# Patient Record
Sex: Female | Born: 1965 | Race: White | Hispanic: No | Marital: Married | Smoking: Never smoker
Health system: Southern US, Community
[De-identification: ages and names within clinical notes are randomized; demographics above are authoritative.]

## PROBLEM LIST (undated history)

## (undated) DIAGNOSIS — Z8669 Personal history of other diseases of the nervous system and sense organs: Secondary | ICD-10-CM

## (undated) DIAGNOSIS — Z87440 Personal history of urinary (tract) infections: Secondary | ICD-10-CM

## (undated) DIAGNOSIS — G43909 Migraine, unspecified, not intractable, without status migrainosus: Secondary | ICD-10-CM

## (undated) DIAGNOSIS — M1990 Unspecified osteoarthritis, unspecified site: Secondary | ICD-10-CM

## (undated) DIAGNOSIS — Q059 Spina bifida, unspecified: Secondary | ICD-10-CM

## (undated) DIAGNOSIS — T7840XA Allergy, unspecified, initial encounter: Secondary | ICD-10-CM

## (undated) DIAGNOSIS — IMO0002 Reserved for concepts with insufficient information to code with codable children: Secondary | ICD-10-CM

## (undated) DIAGNOSIS — K219 Gastro-esophageal reflux disease without esophagitis: Secondary | ICD-10-CM

## (undated) HISTORY — DX: Reserved for concepts with insufficient information to code with codable children: IMO0002

## (undated) HISTORY — DX: Allergy, unspecified, initial encounter: T78.40XA

## (undated) HISTORY — DX: Personal history of urinary (tract) infections: Z87.440

## (undated) HISTORY — DX: Spina bifida, unspecified: Q05.9

## (undated) HISTORY — PX: JOINT REPLACEMENT: SHX530

## (undated) HISTORY — DX: Gastro-esophageal reflux disease without esophagitis: K21.9

## (undated) HISTORY — DX: Unspecified osteoarthritis, unspecified site: M19.90

## (undated) HISTORY — PX: ABDOMINAL HYSTERECTOMY: SHX81

## (undated) HISTORY — DX: Migraine, unspecified, not intractable, without status migrainosus: G43.909

---
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
## 2003-03-29 ENCOUNTER — Other Ambulatory Visit: Admission: RE | Admit: 2003-03-29 | Discharge: 2003-03-29 | Payer: Self-pay | Admitting: Gynecology

## 2005-08-17 ENCOUNTER — Inpatient Hospital Stay (HOSPITAL_COMMUNITY): Admission: AD | Admit: 2005-08-17 | Discharge: 2005-08-18 | Payer: Self-pay | Admitting: Obstetrics and Gynecology

## 2009-02-02 ENCOUNTER — Ambulatory Visit: Payer: Self-pay | Admitting: Pediatrics

## 2009-10-08 HISTORY — PX: ABDOMINAL HYSTERECTOMY: SHX81

## 2009-10-08 HISTORY — PX: LAPAROSCOPY: SHX197

## 2010-07-26 ENCOUNTER — Ambulatory Visit: Payer: Self-pay | Admitting: Family Medicine

## 2010-09-04 ENCOUNTER — Ambulatory Visit: Payer: Self-pay | Admitting: Unknown Physician Specialty

## 2010-09-22 ENCOUNTER — Ambulatory Visit: Payer: Self-pay | Admitting: Unknown Physician Specialty

## 2010-09-26 LAB — PATHOLOGY REPORT

## 2011-10-09 HISTORY — PX: CERVIX REMOVAL: SHX592

## 2012-06-15 LAB — HM MAMMOGRAPHY: HM Mammogram: NORMAL

## 2012-07-03 ENCOUNTER — Ambulatory Visit: Payer: Self-pay | Admitting: Obstetrics and Gynecology

## 2012-07-03 LAB — BASIC METABOLIC PANEL
Anion Gap: 11 (ref 7–16)
BUN: 11 mg/dL (ref 7–18)
Calcium, Total: 8.8 mg/dL (ref 8.5–10.1)
Chloride: 108 mmol/L — ABNORMAL HIGH (ref 98–107)
Co2: 23 mmol/L (ref 21–32)
Creatinine: 0.66 mg/dL (ref 0.60–1.30)
EGFR (African American): >60
EGFR (Non-African Amer.): >60
Glucose: 84 mg/dL (ref 65–99)
Osmolality: 282 (ref 275–301)
Potassium: 4 mmol/L (ref 3.5–5.1)

## 2012-07-03 LAB — CBC
HCT: 36.2 % (ref 35.0–47.0)
HGB: 12.5 g/dL (ref 12.0–16.0)
MCH: 31 pg (ref 26.0–34.0)
MCHC: 34.5 g/dL (ref 32.0–36.0)
MCV: 90 fL (ref 80–100)
RBC: 4.03 10*6/uL (ref 3.80–5.20)
RDW: 13 % (ref 11.5–14.5)
WBC: 7 10*3/uL (ref 3.6–11.0)

## 2012-07-10 ENCOUNTER — Ambulatory Visit: Payer: Self-pay | Admitting: Obstetrics and Gynecology

## 2012-10-15 ENCOUNTER — Encounter: Payer: Self-pay | Admitting: Internal Medicine

## 2012-10-15 ENCOUNTER — Ambulatory Visit (INDEPENDENT_AMBULATORY_CARE_PROVIDER_SITE_OTHER): Payer: 59 | Admitting: Internal Medicine

## 2012-10-15 VITALS — BP 110/78 | HR 76 | Temp 98.4°F | Resp 16 | Ht 59.5 in | Wt 129.5 lb

## 2012-10-15 DIAGNOSIS — J040 Acute laryngitis: Secondary | ICD-10-CM | POA: Insufficient documentation

## 2012-10-15 DIAGNOSIS — R635 Abnormal weight gain: Secondary | ICD-10-CM

## 2012-10-15 DIAGNOSIS — Q059 Spina bifida, unspecified: Secondary | ICD-10-CM

## 2012-10-15 DIAGNOSIS — Z1331 Encounter for screening for depression: Secondary | ICD-10-CM

## 2012-10-15 DIAGNOSIS — H9319 Tinnitus, unspecified ear: Secondary | ICD-10-CM

## 2012-10-15 DIAGNOSIS — K5901 Slow transit constipation: Secondary | ICD-10-CM

## 2012-10-15 DIAGNOSIS — K219 Gastro-esophageal reflux disease without esophagitis: Secondary | ICD-10-CM

## 2012-10-15 NOTE — Progress Notes (Signed)
Patient ID: Leah Pearson, female   DOB: 12-13-1965, 47 y.o.   MRN: [id number]    Patient Active Problem List  Diagnosis  . Constipation by delayed colonic transit  . Reflux laryngitis  . Spina bifida  . Weight gain    Subjective:  CC:   No chief complaint on file.   HPI:   Leah Pearson is a 47 y.o. female who presents as a new patient to establish primary care with the chief complaint of 1)  tinnitus in her her right hear and hearing loss accompanied by pressure in the right ear.  Feels that it has occasionally affected in her voice.   Has to ask people to repeat themselves.   History of acid reflux by EGD over 5 yrs ago  which was   Treated with Protonix   and weight loss . Symptoms resolved until 6 months ago,  symptoms are only occurring during the day. She notes that she has gained 14 lbs and reflux has returned  other she does not endorse burning or tightness in her throat.  Requesting ENT referral.    2) weight gain.  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 She has gained 14 pounds in the last 5 years. Her ability to exercise is limited by persistent foot pain.   3) Chronic constipation.  She has had to evacuate her bowels twice weekly with enemas for many years. She has never had a GI evaluation the wonders if her chronic constipation is due to her spina bifida. No history of hair loss intolerance to cold or heat. No history of underactive thyroid.  4) Foot pain . History of spina bifida   Multiple foot surgeries by age 12 which were done a different orthopedic centers including [facility] in [facility],  ankle fusion done at [facility].   She currently seesan orthopedist at [facility] who has advised her for her to have additional surgery to dissolving ankle joint.      Past Medical History  Diagnosis Date  . Migraines   . Allergy   . Inflammatory polyps     located in gall bladder  . Hx: UTI (urinary tract infection)      Past Surgical History  Procedure Date  . Abdominal hysterectomy 2011   . Cervix removal 2013  . Spinal fusion 1974  . Grise/green 1972  . Lowered arches 1975  . Great toe arthrodesis, jones procedure 1979  . Spinal fusion/eggshell/moll rods/herrington 1984  . Laparoscopy 2011    with hysterectomy    Family History  Problem Relation Age of Onset  . Heart disease Mother   . Diabetes Mother   . Heart disease Father   . Cancer Father   . Stroke Maternal Grandmother   . Heart attack Maternal Grandfather   . Cancer Paternal Grandmother     History   Social History  . Marital Status: Married    Spouse Name: N/A    Number of Children: N/A  . Years of Education: N/A   Occupational History  . Not on file.   Social History Main Topics  . Smoking status: Never Smoker   . Smokeless tobacco: Not on file  . Alcohol Use: 4.2 oz/week    7 Glasses of wine per week  . Drug Use: No  . Sexually Active:    Other Topics Concern  . Not on file   Social History Narrative  . No narrative on file  Allergies  Allergen Reactions  . Ferra-Caps (Iron) Rash     Review of Systems:   The remainder of the review of systems was negative except those addressed in the HPI.   Objective:  BP 110/78  Pulse 76  Temp 98.4 F (36.9 C) (Oral)  Resp 16  Ht 4' 11.5" (1.511 m)  Wt 129 lb 8 oz (58.741 kg)  BMI 25.72 kg/m2  SpO2 97%  General appearance: alert, cooperative and appears stated age Ears: normal TM's and external ear canals both ears Throat: lips, mucosa, and tongue normal; teeth and gums normal Neck: no adenopathy, no carotid bruit, supple, symmetrical, trachea midline and thyroid not enlarged, symmetric, no tenderness/mass/nodules Back: symmetric, no curvature. ROM normal. No CVA tenderness. Lungs: clear to auscultation bilaterally Heart: regular rate and rhythm, S1, S2 normal, no murmur, click, rub or gallop Abdomen: soft, non-tender; bowel sounds normal;  no masses,  no organomegaly Pulses: 2+ and symmetric Skin: Skin color, texture, turgor normal. No rashes or lesions Lymph nodes: Cervical, supraclavicular, and axillary nodes normal.  Assessment and Plan:  Constipation by delayed colonic transit Given her history of spina bifida, I suspect that she has delayed transit due to autonomic neuropathy. I've given her samples of lens as an EMT Z. but have asked her to hold off on starting him until he can discuss her case with one of them with our GI doctors..   Reflux laryngitis I've advised her to resume over-the-counter omeprazole or Prevacid and am referring her to ENT for evaluation of her tinnitus and hearing changes.  Spina bifida Records requested. She's had multiple prior orthopedic surgeries  Weight gain Screening for hypothyroidism will be done. She has had recent labs done I will hold off until he can see her previous labs. Low glycemic index diet handout given.   Updated Medication List Outpatient Encounter Prescriptions as of 10/15/2012  Medication Sig Dispense Refill  . Ascorbic Acid (VITAMIN C) 1000 MG tablet Take 1,000 mg by mouth daily.      . B-12, Methylcobalamin, 1000 MCG SUBL Place 1,000 mcg under the tongue daily.      . Biotin 5000 MCG CAPS Take 5,000 mcg by mouth daily.      . Coenzyme Q10 (CO Q 10 PO) Take 200 mg by mouth daily.      . Flaxseed, Linseed, (FLAXSEED OIL) 1000 MG CAPS Take 1 capsule by mouth daily.      . Lutein 6 MG CAPS Take 6 mg by mouth daily.      . milk thistle 175 MG tablet Take 175 mg by mouth daily.         Orders Placed This Encounter  Procedures  . HM MAMMOGRAPHY  . TSH  . Ambulatory referral to ENT    No Follow-up on file.

## 2012-10-15 NOTE — Patient Instructions (Addendum)
Try [employer]'s pasta,  5 carb/serving    Mission carb balance whole wheat tortilla 6 net carb,  26 g fiber 210 cal   BJS and [employer] also small size   Goal LDL 160 , HDL > 50  Trigs < 150   The dose for amitiza is twice daily  (two strengths) and linzess is once daily  Do not start until you hear from me .  I want to check to make sure there are no C/I to using

## 2012-10-16 ENCOUNTER — Encounter: Payer: Self-pay | Admitting: Internal Medicine

## 2012-10-16 NOTE — Assessment & Plan Note (Signed)
Given her history of spina bifida, I suspect that she has delayed transit due to autonomic neuropathy. I've given her samples of lens as an EMT Z. but have asked her to hold off on starting him until he can discuss her case with one of them with our GI doctors.Marland Kitchen

## 2012-10-16 NOTE — Assessment & Plan Note (Signed)
Screening for hypothyroidism will be done. She has had recent labs done I will hold off until he can see her previous labs. Low glycemic index diet handout given.

## 2012-10-16 NOTE — Assessment & Plan Note (Signed)
I've advised her to resume over-the-counter omeprazole or Prevacid and am referring her to ENT for evaluation of her tinnitus and hearing changes.

## 2012-10-16 NOTE — Assessment & Plan Note (Signed)
Records requested. She's had multiple prior orthopedic surgeries

## 2012-10-20 ENCOUNTER — Encounter: Payer: Self-pay | Admitting: Internal Medicine

## 2012-10-21 ENCOUNTER — Encounter: Payer: Self-pay | Admitting: Internal Medicine

## 2012-11-17 ENCOUNTER — Encounter: Payer: Self-pay | Admitting: Internal Medicine

## 2012-11-22 ENCOUNTER — Other Ambulatory Visit: Payer: Self-pay

## 2012-12-01 ENCOUNTER — Telehealth: Payer: Self-pay | Admitting: Internal Medicine

## 2012-12-01 ENCOUNTER — Encounter: Payer: Self-pay | Admitting: Internal Medicine

## 2012-12-01 NOTE — Telephone Encounter (Signed)
error 

## 2012-12-02 ENCOUNTER — Ambulatory Visit: Payer: 59 | Admitting: Internal Medicine

## 2012-12-09 ENCOUNTER — Encounter: Payer: Self-pay | Admitting: Internal Medicine

## 2012-12-09 DIAGNOSIS — Z23 Encounter for immunization: Secondary | ICD-10-CM

## 2012-12-10 ENCOUNTER — Other Ambulatory Visit: Payer: Self-pay | Admitting: Internal Medicine

## 2012-12-10 ENCOUNTER — Encounter: Payer: Self-pay | Admitting: Internal Medicine

## 2012-12-10 DIAGNOSIS — Z23 Encounter for immunization: Secondary | ICD-10-CM

## 2012-12-16 LAB — VARICELLA ZOSTER ABS, IGG/IGM
Varicella IgG: 2.95 index (ref >1.09)
Varicella IgM: <0.91 index (ref 0.00–0.90)

## 2012-12-17 ENCOUNTER — Encounter: Payer: Self-pay | Admitting: Internal Medicine

## 2013-08-13 ENCOUNTER — Other Ambulatory Visit: Payer: Self-pay

## 2013-10-08 HISTORY — PX: TOTAL ANKLE REPLACEMENT: SUR1218

## 2013-10-16 ENCOUNTER — Ambulatory Visit (INDEPENDENT_AMBULATORY_CARE_PROVIDER_SITE_OTHER): Payer: 59 | Admitting: Internal Medicine

## 2013-10-16 ENCOUNTER — Encounter: Payer: Self-pay | Admitting: Internal Medicine

## 2013-10-16 VITALS — BP 118/86 | HR 83 | Temp 98.3°F | Resp 16 | Ht 60.5 in | Wt 129.5 lb

## 2013-10-16 DIAGNOSIS — R5383 Other fatigue: Secondary | ICD-10-CM

## 2013-10-16 DIAGNOSIS — R5381 Other malaise: Secondary | ICD-10-CM

## 2013-10-16 DIAGNOSIS — Z1239 Encounter for other screening for malignant neoplasm of breast: Secondary | ICD-10-CM

## 2013-10-16 DIAGNOSIS — M19079 Primary osteoarthritis, unspecified ankle and foot: Secondary | ICD-10-CM

## 2013-10-16 DIAGNOSIS — Z9889 Other specified postprocedural states: Secondary | ICD-10-CM

## 2013-10-16 DIAGNOSIS — Z90710 Acquired absence of both cervix and uterus: Secondary | ICD-10-CM

## 2013-10-16 DIAGNOSIS — Z0000 Encounter for general adult medical examination without abnormal findings: Secondary | ICD-10-CM

## 2013-10-16 DIAGNOSIS — E559 Vitamin D deficiency, unspecified: Secondary | ICD-10-CM

## 2013-10-16 NOTE — Progress Notes (Signed)
Patient ID: Leah Pearson, female   DOB: 06-11-1966, 48 y.o.   MRN: [id number]   Subjective:     Leah Pearson is a 48 y.o. female and is here for a comprehensive physical exam. The patient reports no problems. Since her last annual visit she underwent left ankle ligament repair and arthroscopy by Dr. Cindie Laroche at [facility].  She was not prescribed PT afterward and is a little disappointed with the progress she has made , but plans on having the ankle ankle done this summer.  She has chronic degenerative changes imposed by the altered gait and secondary to her condition of spina bifida.  She continues to have chronic constipation requiring evacuation twice weekly with enemas.  Trial of Linzess and Amitiza did not help.    History   Social History  . Marital Status: Married    Spouse Name: N/A    Number of Children: N/A  . Years of Education: N/A   Occupational History  . Not on file.   Social History Main Topics  . Smoking status: Never Smoker   . Smokeless tobacco: Not on file  . Alcohol Use: 4.2 oz/week    7 Glasses of wine per week  . Drug Use: No  . Sexual Activity:    Other Topics Concern  . Not on file   Social History Narrative  . No narrative on file   Health Maintenance  Topic Date Due  . Pap Smear  01/29/1986  . Influenza Vaccine  04/06/2014  . Tetanus/tdap  08/15/2022    The following portions of the patient's history were reviewed and updated as appropriate: allergies, current medications, past family history, past medical history, past social history, past surgical history and problem list.  Review of Systems A comprehensive review of systems was negative.   Objective:  BP 118/86  Pulse 83  Temp(Src) 98.3 F (36.8 C) (Oral)  Resp 16  Ht 5' 0.5" (1.537 m)  Wt 129 lb 8 oz (58.741 kg)  BMI 24.87 kg/m2  SpO2 98%  General Appearance:    Alert, cooperative, no distress, appears stated age  Head:    Normocephalic, without obvious abnormality, atraumatic  Eyes:     PERRL, conjunctiva/corneas clear, EOM's intact, fundi    benign, both eyes  Ears:    Normal TM's and external ear canals, both ears  Nose:   Nares normal, septum midline, mucosa normal, no drainage    or sinus tenderness  Throat:   Lips, mucosa, and tongue normal; teeth and gums normal  Neck:   Supple, symmetrical, trachea midline, no adenopathy;    thyroid:  no enlargement/tenderness/nodules; no carotid   bruit or JVD  Back:     Symmetric, no curvature, ROM normal, no CVA tenderness  Lungs:     Clear to auscultation bilaterally, respirations unlabored  Chest Wall:    No tenderness or deformity   Heart:    Regular rate and rhythm, S1 and S2 normal, no murmur, rub   or gallop  Breast Exam:    No tenderness, masses, or nipple abnormality  Abdomen:     Soft, non-tender, bowel sounds active all four quadrants,    no masses, no organomegaly  Extremities:   Well healed surgical scars left ankle and forefoot. ROM  Mildly restricted due to pain , Achilles tendon loose. Extremities normal, atraumatic, no cyanosis or edema  Pulses:   2+ and symmetric all extremities  Skin:   Skin color, texture, turgor normal, no rashes or lesions  Lymph nodes:   Cervical, supraclavicular, and axillary nodes normal  Neurologic:   CNII-XII intact, normal strength, sensation and reflexes    throughout    Assessment and Plan:   S/P ankle ligament repair April 2014 ,.  Dr Cindie Laroche at [place].  Needs PT eval and treatment for persistent restricted ROM.  Achilles tendon is not tight.  Visit for preventive health examination Annual comprehensive exam was done including breast, excluding pelvic and PAP smear. All screenings have been addressed .    Updated Medication List Outpatient Encounter Prescriptions as of 10/16/2013  Medication Sig  . Ascorbic Acid (VITAMIN C) 1000 MG tablet Take 1,000 mg by mouth daily.  . B-12, Methylcobalamin, 1000 MCG SUBL Place 1,000 mcg under the tongue daily.  . Biotin 5000 MCG CAPS  Take 5,000 mcg by mouth daily.  . Coenzyme Q10 (CO Q 10 PO) Take 200 mg by mouth daily.  . Flaxseed, Linseed, (FLAXSEED OIL) 1000 MG CAPS Take 1 capsule by mouth daily.  . Lutein 6 MG CAPS Take 6 mg by mouth daily.  . milk thistle 175 MG tablet Take 175 mg by mouth daily.

## 2013-10-16 NOTE — Progress Notes (Signed)
Pre-visit discussion using our clinic review tool. No additional management support is needed unless otherwise documented below in the visit note.  

## 2013-10-16 NOTE — Patient Instructions (Signed)
You had your annual wellness exam today  We will schedule your mammogram soon at [facility]  We will contact you with the bloodwork results

## 2013-10-18 ENCOUNTER — Encounter: Payer: Self-pay | Admitting: Internal Medicine

## 2013-10-18 DIAGNOSIS — Z9889 Other specified postprocedural states: Secondary | ICD-10-CM | POA: Insufficient documentation

## 2013-10-18 DIAGNOSIS — Z0000 Encounter for general adult medical examination without abnormal findings: Secondary | ICD-10-CM | POA: Insufficient documentation

## 2013-10-18 DIAGNOSIS — Z90710 Acquired absence of both cervix and uterus: Secondary | ICD-10-CM | POA: Insufficient documentation

## 2013-10-18 NOTE — Assessment & Plan Note (Addendum)
April 2014 ,.  Dr Cindie Laroche at [place].  Needs PT eval and treatment for persistent restricted ROM.  Achilles tendon is not tight.

## 2013-10-18 NOTE — Assessment & Plan Note (Signed)
Annual comprehensive exam was done including breast, excluding pelvic and PAP smear. All screenings have been addressed .  

## 2013-10-22 LAB — CBC WITH DIFFERENTIAL/PLATELET
Basophils Absolute: 0.1 10*3/uL (ref 0.0–0.2)
Basos: 1 %
Eos: 1 %
Eosinophils Absolute: 0.1 10*3/uL (ref 0.0–0.4)
HCT: 34.8 % (ref 34.0–46.6)
Hemoglobin: 11.8 g/dL (ref 11.1–15.9)
IMMATURE GRANS (ABS): 0 10*3/uL (ref 0.0–0.1)
Immature Granulocytes: 0 %
Lymphocytes Absolute: 2.9 10*3/uL (ref 0.7–3.1)
Lymphs: 46 %
MCH: 30.3 pg (ref 26.6–33.0)
MCHC: 33.9 g/dL (ref 31.5–35.7)
MCV: 89 fL (ref 79–97)
Monocytes Absolute: 0.4 10*3/uL (ref 0.1–0.9)
Monocytes: 7 %
Neutrophils Absolute: 2.8 10*3/uL (ref 1.4–7.0)
Neutrophils Relative %: 45 %
RBC: 3.9 x10E6/uL (ref 3.77–5.28)
RDW: 13.3 % (ref 12.3–15.4)
WBC: 6.3 10*3/uL (ref 3.4–10.8)

## 2013-10-22 LAB — FOLATE RBC
FOLATE, RBC: 1151 ng/mL (ref 499–1504)
Folate, Hemolysate: 400.6 ng/mL

## 2013-10-22 LAB — VITAMIN D 25 HYDROXY (VIT D DEFICIENCY, FRACTURES): Vit D, 25-Hydroxy: 13.5 ng/mL — ABNORMAL LOW (ref 30.0–100.0)

## 2013-10-22 LAB — LIPID PANEL
Chol/HDL Ratio: 2.2 ratio units (ref 0.0–4.4)
Cholesterol, Total: 212 mg/dL — ABNORMAL HIGH (ref 100–199)
HDL: 98 mg/dL (ref >39)
LDL Calculated: 104 mg/dL — ABNORMAL HIGH (ref 0–99)
Triglycerides: 48 mg/dL (ref 0–149)
VLDL Cholesterol Cal: 10 mg/dL (ref 5–40)

## 2013-10-22 LAB — COMPREHENSIVE METABOLIC PANEL
ALT: 13 IU/L (ref 0–32)
AST: 25 IU/L (ref 0–40)
Albumin/Globulin Ratio: 1.9 (ref 1.1–2.5)
Albumin: 4.2 g/dL (ref 3.5–5.5)
Alkaline Phosphatase: 46 IU/L (ref 39–117)
BUN/Creatinine Ratio: 20 (ref 9–23)
BUN: 15 mg/dL (ref 6–24)
CHLORIDE: 105 mmol/L (ref 97–108)
CO2: 22 mmol/L (ref 18–29)
Calcium: 9.1 mg/dL (ref 8.7–10.2)
Creatinine, Ser: 0.75 mg/dL (ref 0.57–1.00)
GFR calc Af Amer: 111 mL/min/{1.73_m2} (ref >59)
GFR calc non Af Amer: 97 mL/min/{1.73_m2} (ref >59)
Globulin, Total: 2.2 g/dL (ref 1.5–4.5)
Glucose: 80 mg/dL (ref 65–99)
POTASSIUM: 4 mmol/L (ref 3.5–5.2)
Sodium: 141 mmol/L (ref 134–144)
TOTAL PROTEIN: 6.4 g/dL (ref 6.0–8.5)
Total Bilirubin: 0.4 mg/dL (ref 0.0–1.2)

## 2013-10-22 LAB — TSH: TSH: 3.4 u[IU]/mL (ref 0.450–4.500)

## 2013-10-22 LAB — VITAMIN B12: Vitamin B-12: 453 pg/mL (ref 211–946)

## 2013-10-23 ENCOUNTER — Encounter: Payer: Self-pay | Admitting: Internal Medicine

## 2013-10-23 DIAGNOSIS — E559 Vitamin D deficiency, unspecified: Secondary | ICD-10-CM | POA: Insufficient documentation

## 2013-10-23 MED ORDER — ERGOCALCIFEROL 1.25 MG (50000 UT) PO CAPS
50000.0000 [IU] | ORAL_CAPSULE | ORAL | Status: DC
Start: 2013-10-23 — End: 2014-02-15

## 2013-10-23 NOTE — Addendum Note (Signed)
Addended by: Crecencio Mc on: 10/23/2013 01:14 PM   Modules accepted: Orders

## 2013-11-02 ENCOUNTER — Encounter: Payer: Self-pay | Admitting: Internal Medicine

## 2013-11-08 ENCOUNTER — Encounter: Payer: Self-pay | Admitting: Internal Medicine

## 2013-11-18 ENCOUNTER — Encounter: Payer: Self-pay | Admitting: Internal Medicine

## 2013-12-06 ENCOUNTER — Encounter: Payer: Self-pay | Admitting: Internal Medicine

## 2014-02-15 ENCOUNTER — Ambulatory Visit (INDEPENDENT_AMBULATORY_CARE_PROVIDER_SITE_OTHER): Payer: 59 | Admitting: Family Medicine

## 2014-02-15 ENCOUNTER — Encounter: Payer: Self-pay | Admitting: Family Medicine

## 2014-02-15 VITALS — BP 114/78 | HR 96 | Temp 98.4°F | Wt 129.5 lb

## 2014-02-15 DIAGNOSIS — H6690 Otitis media, unspecified, unspecified ear: Secondary | ICD-10-CM

## 2014-02-15 MED ORDER — FLUCONAZOLE 150 MG PO TABS: 150.0000 mg | ORAL_TABLET | Freq: Once | ORAL | Status: DC

## 2014-02-15 MED ORDER — AMOXICILLIN 875 MG PO TABS: 875.0000 mg | ORAL_TABLET | Freq: Two times a day (BID) | ORAL | Status: DC

## 2014-02-15 NOTE — Progress Notes (Signed)
Pre visit review using our clinic review tool, if applicable. No additional management support is needed unless otherwise documented below in the visit note.  L ear pain, started about 2-3 days ago.  Had been draining, tingled fluid, not totally clear.  No fevers.  On R ear sx.  L ear hearing is decreased recently.  Some ST.  Stuffy.  No cough, some throat clear.  No vomiting, no diarrhea.    Meds, vitals, and allergies reviewed.   ROS: See HPI.  Otherwise, noncontributory.  GEN: nad, alert and oriented HEENT: mucous membranes moist, R tm w/o erythema, L TM red, ear canals wnl B.  nasal exam w/o erythema, scant clear discharge noted,  OP with mild cobblestoning.  L mastoid and pinna not ttp NECK: supple w/o LA CV: rrr.   PULM: ctab, no inc wob

## 2014-02-15 NOTE — Assessment & Plan Note (Signed)
Nontoxic, amoxil and f/u prn.  D/w pt.  Air>bone conduction with weber lateralizing to L ear as expected.  Her hearing should return to wnl.  D/w pt.

## 2014-02-15 NOTE — Patient Instructions (Signed)
Start the amoxil today and take aleve with food for pain. Take care. Use the diflucan if needed.

## 2015-02-28 ENCOUNTER — Ambulatory Visit (INDEPENDENT_AMBULATORY_CARE_PROVIDER_SITE_OTHER): Payer: 59 | Admitting: Internal Medicine

## 2015-02-28 ENCOUNTER — Encounter: Payer: Self-pay | Admitting: Internal Medicine

## 2015-02-28 VITALS — BP 118/72 | HR 80 | Temp 98.0°F | Resp 14 | Ht 60.0 in | Wt 138.0 lb

## 2015-02-28 DIAGNOSIS — E663 Overweight: Secondary | ICD-10-CM

## 2015-02-28 DIAGNOSIS — Z1159 Encounter for screening for other viral diseases: Secondary | ICD-10-CM

## 2015-02-28 DIAGNOSIS — R5383 Other fatigue: Secondary | ICD-10-CM | POA: Diagnosis not present

## 2015-02-28 DIAGNOSIS — Z0000 Encounter for general adult medical examination without abnormal findings: Secondary | ICD-10-CM

## 2015-02-28 DIAGNOSIS — E785 Hyperlipidemia, unspecified: Secondary | ICD-10-CM

## 2015-02-28 DIAGNOSIS — Z96662 Presence of left artificial ankle joint: Secondary | ICD-10-CM

## 2015-02-28 NOTE — Patient Instructions (Signed)

## 2015-02-28 NOTE — Progress Notes (Signed)
Patient ID: Leah Pearson, female    DOB: 10/22/1965  Age: 49 y.o. MRN: [id number]  The patient is here for annual  NONGYN wellness examination and management of other chronic and acute problems  Last seen jan 2015.  Had ankle replacement Dec 10th  OF LEFT ANKLE AFTER HAVING WORSENING PAIN AND MOBILIRY,  Pain has improved tremeNdously ,  Some ankle swelling   Increased ROM. Glad she has the surgery.   .    Getting  2000 IUs of D3 in the mulvitamin product  Nerium, has noticed an imprvement in fatri and nailas,    takIing the supplement for the past year  .    The risk factors are reflected in the social history.  The roster of all physicians providing medical care to patient - is listed in the Snapshot section of the chart.   Home safety : The patient has smoke detectors in the home. They wear seatbelts.  There are no firearms at home. There is no violence in the home.   There is no risks for hepatitis, STDs or HIV. There is no   history of blood transfusion. They have no travel history to infectious disease endemic areas of the world.  The patient has seen their dentist in the last six month. They have seen their eye doctor in the last year. They admit to slight hearing difficulty with regard to whispered voices and some television programs.  They have deferred audiologic testing in the last year.  They do not  have excessive sun exposure. Discussed the need for sun protection: hats, long sleeves and use of sunscreen if there is significant sun exposure.   Diet: the importance of a healthy diet is discussed. They do have a healthy diet.  The benefits of regular aerobic exercise were discussed. She walks 4 times per week ,  20 minutes.   Depression screen: there are no signs or vegative symptoms of depression- irritability, change in appetite, anhedonia, sadness/tearfullness.   The following portions of the patient's history were reviewed and updated as appropriate: allergies, current  medications, past family history, past medical history,  past surgical history, past social history  and problem list.  Visual acuity was not assessed per patient preference since she has regular follow up with her ophthalmologist. Hearing and body mass index were assessed and reviewed.   History Leah Pearson has a past medical history of Migraines; Allergy; Inflammatory polyps; and UTI (urinary tract infection).   She has past surgical history that includes Abdominal hysterectomy (2011 ); Cervix removal (2013); Spinal fusion (1974); grise/green (1972); lowered arches (1975); Great toe arthrodesis, Jones procedure (1979); spinal fusion/eggshell/moll rods/herrington (1984); and laparoscopy (2011).   Her family history includes Cancer in her father and paternal grandmother; Diabetes in her mother; Heart attack in her maternal grandfather; Heart disease in her father and mother; Stroke in her maternal grandmother.She reports that she has never smoked. She has never used smokeless tobacco. She reports that she drinks about 4.2 oz of alcohol per week. She reports that she does not use illicit drugs.  Outpatient Prescriptions Prior to Visit  Medication Sig Dispense Refill  . Naproxen Sodium (ALEVE) 220 MG CAPS Take by mouth as needed.    . pseudoephedrine (SUDAFED) 120 MG 12 hr tablet Take 120 mg by mouth as needed for congestion.    Marland Kitchen amoxicillin (AMOXIL) 875 MG tablet Take 1 tablet (875 mg total) by mouth 2 (two) times daily. 20 tablet 0  . Ascorbic Acid (VITAMIN  C) 1000 MG tablet Take 1,000 mg by mouth daily.    . B-12, Methylcobalamin, 1000 MCG SUBL Place 1,000 mcg under the tongue daily.    . Biotin 5000 MCG CAPS Take 5,000 mcg by mouth daily.    . Cholecalciferol (VITAMIN D3) 2000 UNITS TABS Take by mouth daily.    . Coenzyme Q10 (CO Q 10 PO) Take 200 mg by mouth daily.    . Flaxseed, Linseed, (FLAXSEED OIL) 1000 MG CAPS Take 1 capsule by mouth daily.    . fluconazole (DIFLUCAN) 150 MG tablet Take 1  tablet (150 mg total) by mouth once. (Patient not taking: Reported on 02/28/2015) 1 tablet 0  . milk thistle 175 MG tablet Take 175 mg by mouth daily.     No facility-administered medications prior to visit.    Review of Systems   Patient denies headache, fevers, malaise, unintentional weight loss, skin rash, eye pain, sinus congestion and sinus pain, sore throat, dysphagia,  hemoptysis , cough, dyspnea, wheezing, chest pain, palpitations, orthopnea, edema, abdominal pain, nausea, melena, diarrhea, constipation, flank pain, dysuria, hematuria, urinary  Frequency, nocturia, numbness, tingling, seizures,  Focal weakness, Loss of consciousness,  Tremor, insomnia, depression, anxiety, and suicidal ideation.      Objective:  BP 118/72 mmHg  Pulse 80  Temp(Src) 98 F (36.7 C) (Oral)  Resp 14  Ht 5' (1.524 m)  Wt 138 lb (62.596 kg)  BMI 26.95 kg/m2  SpO2 98%  Physical Exam    Assessment & Plan:   Problem List Items Addressed This Visit    None      I have discontinued Leah Pearson Flaxseed Oil, Coenzyme Q10 (CO Q 10 PO), Biotin, vitamin C, B-12 (Methylcobalamin), milk thistle, Vitamin D3, amoxicillin, and fluconazole. I am also having her maintain her Naproxen Sodium, pseudoephedrine, and multivitamin with minerals.  Meds ordered this encounter  Medications  . Multiple Vitamins-Minerals (MULTIVITAMIN WITH MINERALS) tablet    Sig: Take 1 tablet by mouth daily. EHT vitamin replacement    Medications Discontinued During This Encounter  Medication Reason  . amoxicillin (AMOXIL) 875 MG tablet Completed Course  . Ascorbic Acid (VITAMIN C) 1000 MG tablet Error  . B-12, Methylcobalamin, 1000 MCG SUBL Error  . Biotin 5000 MCG CAPS Error  . Cholecalciferol (VITAMIN D3) 2000 UNITS TABS Error  . Coenzyme Q10 (CO Q 10 PO) Error  . Flaxseed, Linseed, (FLAXSEED OIL) 1000 MG CAPS Error  . fluconazole (DIFLUCAN) 150 MG tablet Error  . milk thistle 175 MG tablet Error    Follow-up: No  Follow-up on file.   Crecencio Mc, MD

## 2015-03-01 DIAGNOSIS — E663 Overweight: Secondary | ICD-10-CM | POA: Insufficient documentation

## 2015-03-01 DIAGNOSIS — Z96669 Presence of unspecified artificial ankle joint: Secondary | ICD-10-CM | POA: Insufficient documentation

## 2015-03-01 LAB — COMPREHENSIVE METABOLIC PANEL
ALT: 12 U/L (ref 0–35)
AST: 20 U/L (ref 0–37)
Albumin: 4 g/dL (ref 3.5–5.2)
Alkaline Phosphatase: 51 U/L (ref 39–117)
BUN: 12 mg/dL (ref 6–23)
CHLORIDE: 104 meq/L (ref 96–112)
CO2: 25 mEq/L (ref 19–32)
Calcium: 9.3 mg/dL (ref 8.4–10.5)
Creatinine, Ser: 0.65 mg/dL (ref 0.40–1.20)
GFR: 103.81 mL/min (ref >60.00)
Glucose, Bld: 93 mg/dL (ref 70–99)
Potassium: 4 mEq/L (ref 3.5–5.1)
Sodium: 137 mEq/L (ref 135–145)
Total Bilirubin: 0.5 mg/dL (ref 0.2–1.2)
Total Protein: 7.4 g/dL (ref 6.0–8.3)

## 2015-03-01 LAB — CBC WITH DIFFERENTIAL/PLATELET
Basophils Absolute: 0 10*3/uL (ref 0.0–0.1)
Basophils Relative: 0.3 % (ref 0.0–3.0)
EOS ABS: 0.1 10*3/uL (ref 0.0–0.7)
EOS PCT: 0.8 % (ref 0.0–5.0)
HCT: 34.9 % — ABNORMAL LOW (ref 36.0–46.0)
Hemoglobin: 11.8 g/dL — ABNORMAL LOW (ref 12.0–15.0)
Lymphocytes Relative: 35.3 % (ref 12.0–46.0)
Lymphs Abs: 2.6 10*3/uL (ref 0.7–4.0)
MCHC: 33.8 g/dL (ref 30.0–36.0)
MCV: 88.6 fl (ref 78.0–100.0)
Monocytes Absolute: 0.4 10*3/uL (ref 0.1–1.0)
Monocytes Relative: 6.1 % (ref 3.0–12.0)
Neutro Abs: 4.2 10*3/uL (ref 1.4–7.7)
Neutrophils Relative %: 57.5 % (ref 43.0–77.0)
Platelets: 241 10*3/uL (ref 150.0–400.0)
RBC: 3.94 Mil/uL (ref 3.87–5.11)
RDW: 14 % (ref 11.5–15.5)
WBC: 7.2 10*3/uL (ref 4.0–10.5)

## 2015-03-01 LAB — TSH: TSH: 2.12 u[IU]/mL (ref 0.35–4.50)

## 2015-03-01 LAB — LDL CHOLESTEROL, DIRECT: Direct LDL: 79 mg/dL

## 2015-03-01 LAB — HEPATITIS C ANTIBODY: HCV Ab: NEGATIVE

## 2015-03-01 NOTE — Assessment & Plan Note (Signed)
Mild,  I have addressed  BMI and recommended a low glycemic index diet utilizing smaller more frequent meals to increase metabolism.  I have also recommended that patient start exercising with a goal of 30 minutes of aerobic exercise a minimum of 5 days per week. Screening for lipid disorders, thyroid and diabetes to be done today.  Lab Results  Component Value Date   TSH 3.400 10/20/2013   Lab Results  Component Value Date   NA 141 10/20/2013   K 4.0 10/20/2013   CL 105 10/20/2013   CO2 22 10/20/2013   Lab Results  Component Value Date   ALT 13 10/20/2013   AST 25 10/20/2013   ALKPHOS 46 10/20/2013   BILITOT 0.4 10/20/2013

## 2015-03-01 NOTE — Assessment & Plan Note (Signed)

## 2015-03-01 NOTE — Assessment & Plan Note (Signed)
Recovering well,  Cleared for exercise

## 2015-03-01 NOTE — Progress Notes (Signed)
Patient ID: Leah Pearson, female    DOB: Apr 20, 1966  Age: 49 y.o. MRN: [id number]  The patient is here for annual wellness examination and management of other chronic and acute problems.  She underwent left ankle replacement in December 2015 by Dr Debby Bud for persistent and limited mobility.  She is now walking with improved mobility and having mild to no pain.  Has been taking a supplement and feels great,  Hair and skin have improved and joints feel better.  Planning to start an exercise program to lose the 12 lbs she gained during recovery from surgery     The risk factors are reflected in the social history.  The roster of all physicians providing medical care to patient - is listed in the Snapshot section of the chart.  Activities of daily living:  The patient is 100% independent in all ADLs: dressing, toileting, feeding as well as independent mobility  Home safety : The patient has smoke detectors in the home. They wear seatbelts.  There are no firearms at home. There is no violence in the home.   There is no risks for hepatitis, STDs or HIV. There is no   history of blood transfusion. They have no travel history to infectious disease endemic areas of the world.  The patient has seen their dentist in the last six month. They have seen their eye doctor in the last year. They admit to slight hearing difficulty with regard to whispered voices and some television programs.  They have deferred audiologic testing in the last year.  They do not  have excessive sun exposure. Discussed the need for sun protection: hats, long sleeves and use of sunscreen if there is significant sun exposure.   Diet: the importance of a healthy diet is discussed. They do have a healthy diet.  The benefits of regular aerobic exercise were discussed. She walks 4 times per week ,  20 minutes.   Depression screen: there are no signs or vegative symptoms of depression- irritability, change in appetite, anhedonia,  sadness/tearfullness.  Cognitive assessment: the patient manages all their financial and personal affairs and is actively engaged. They could relate day,date,year and events; recalled 2/3 objects at 3 minutes; performed clock-face test normally.  The following portions of the patient's history were reviewed and updated as appropriate: allergies, current medications, past family history, past medical history,  past surgical history, past social history  and problem list.  Visual acuity was not assessed per patient preference since she has regular follow up with her ophthalmologist. Hearing and body mass index were assessed and reviewed.   During the course of the visit the patient was educated and counseled about appropriate screening and preventive services including : fall prevention , diabetes screening, nutrition counseling, colorectal cancer screening, and recommended immunizations.    CC: The primary encounter diagnosis was Other fatigue. Diagnoses of Need for hepatitis C screening test, Hyperlipidemia, Overweight, Visit for preventive health examination, and S/P ankle joint replacement, left were also pertinent to this visit.  History Leah Pearson has a past medical history of Migraines; Allergy; Inflammatory polyps; and UTI (urinary tract infection).   She has past surgical history that includes Abdominal hysterectomy (2011 ); Cervix removal (2013); Spinal fusion (1974); grise/green (1972); lowered arches (1975); Great toe arthrodesis, Jones procedure (1979); spinal fusion/eggshell/moll rods/herrington (1984); and laparoscopy (2011).   Her family history includes Cancer in her paternal grandmother; Cancer (age of onset: 75) in her father; Diabetes in her mother; Heart attack in her maternal  grandfather; Heart disease in her father and mother; Stroke in her maternal grandmother.She reports that she has never smoked. She has never used smokeless tobacco. She reports that she drinks about 4.2 oz of  alcohol per week. She reports that she does not use illicit drugs.  Outpatient Prescriptions Prior to Visit  Medication Sig Dispense Refill  . Naproxen Sodium (ALEVE) 220 MG CAPS Take by mouth as needed.    . pseudoephedrine (SUDAFED) 120 MG 12 hr tablet Take 120 mg by mouth as needed for congestion.    Marland Kitchen amoxicillin (AMOXIL) 875 MG tablet Take 1 tablet (875 mg total) by mouth 2 (two) times daily. 20 tablet 0  . Ascorbic Acid (VITAMIN C) 1000 MG tablet Take 1,000 mg by mouth daily.    . B-12, Methylcobalamin, 1000 MCG SUBL Place 1,000 mcg under the tongue daily.    . Biotin 5000 MCG CAPS Take 5,000 mcg by mouth daily.    . Cholecalciferol (VITAMIN D3) 2000 UNITS TABS Take by mouth daily.    . Coenzyme Q10 (CO Q 10 PO) Take 200 mg by mouth daily.    . Flaxseed, Linseed, (FLAXSEED OIL) 1000 MG CAPS Take 1 capsule by mouth daily.    . fluconazole (DIFLUCAN) 150 MG tablet Take 1 tablet (150 mg total) by mouth once. (Patient not taking: Reported on 02/28/2015) 1 tablet 0  . milk thistle 175 MG tablet Take 175 mg by mouth daily.     No facility-administered medications prior to visit.    Review of Systems   Patient denies headache, fevers, malaise, unintentional weight loss, skin rash, eye pain, sinus congestion and sinus pain, sore throat, dysphagia,  hemoptysis , cough, dyspnea, wheezing, chest pain, palpitations, orthopnea, edema, abdominal pain, nausea, melena, diarrhea, constipation, flank pain, dysuria, hematuria, urinary  Frequency, nocturia, numbness, tingling, seizures,  Focal weakness, Loss of consciousness,  Tremor, insomnia, depression, anxiety, and suicidal ideation.      Objective:  BP 118/72 mmHg  Pulse 80  Temp(Src) 98 F (36.7 C) (Oral)  Resp 14  Ht 5' (1.524 m)  Wt 138 lb (62.596 kg)  BMI 26.95 kg/m2  SpO2 98%  Physical Exam  General appearance: alert, cooperative and appears stated age Head: Normocephalic, without obvious abnormality, atraumatic Eyes:  conjunctivae/corneas clear. PERRL, EOM's intact. Fundi benign. Ears: normal TM's and external ear canals both ears Nose: Nares normal. Septum midline. Mucosa normal. No drainage or sinus tenderness. Throat: lips, mucosa, and tongue normal; teeth and gums normal Neck: no adenopathy, no carotid bruit, no JVD, supple, symmetrical, trachea midline and thyroid not enlarged, symmetric, no tenderness/mass/nodules Lungs: clear to auscultation bilaterally Breasts: normal appearance, no masses or tenderness Heart: regular rate and rhythm, S1, S2 normal, no murmur, click, rub or gallop Abdomen: soft, non-tender; bowel sounds normal; no masses,  no organomegaly Extremities: extremities normal, atraumatic, no cyanosis or edema Pulses: 2+ and symmetric Skin: Skin color, texture, turgor normal. No rashes or lesions Neurologic: Alert and oriented X 3, normal strength and tone. Normal symmetric reflexes. Normal coordination and gait.    Assessment & Plan:   Problem List Items Addressed This Visit    Visit for preventive health examination    Annual wellness  exam was done as well as a comprehensive physical exam and management of acute and chronic conditions .  During the course of the visit the patient was educated and counseled about appropriate screening and preventive services including :  diabetes screening, lipid analysis with projected  10 year  risk  for CAD , nutrition counseling, colorectal cancer screening, and recommended immunizations.  Printed recommendations for health maintenance screenings was given.        Overweight    Mild,  I have addressed  BMI and recommended a low glycemic index diet utilizing smaller more frequent meals to increase metabolism.  I have also recommended that patient start exercising with a goal of 30 minutes of aerobic exercise a minimum of 5 days per week. Screening for lipid disorders, thyroid and diabetes to be done today.  Lab Results  Component Value Date   TSH  3.400 10/20/2013   Lab Results  Component Value Date   NA 141 10/20/2013   K 4.0 10/20/2013   CL 105 10/20/2013   CO2 22 10/20/2013   Lab Results  Component Value Date   ALT 13 10/20/2013   AST 25 10/20/2013   ALKPHOS 46 10/20/2013   BILITOT 0.4 10/20/2013         S/P ankle joint replacement    Recovering well,  Cleared for exercise        Other Visit Diagnoses    Other fatigue    -  Primary    Relevant Orders    CBC with Differential/Platelet    TSH    Comprehensive metabolic panel    Need for hepatitis C screening test        Relevant Orders    Hepatitis C antibody (Completed)    Hyperlipidemia        Relevant Orders    LDL cholesterol, direct       I have discontinued Leah Pearson Flaxseed Oil, Coenzyme Q10 (CO Q 10 PO), Biotin, vitamin C, B-12 (Methylcobalamin), milk thistle, Vitamin D3, amoxicillin, and fluconazole. I am also having her maintain her Naproxen Sodium, pseudoephedrine, and multivitamin with minerals.  Meds ordered this encounter  Medications  . Multiple Vitamins-Minerals (MULTIVITAMIN WITH MINERALS) tablet    Sig: Take 1 tablet by mouth daily. EHT vitamin replacement    Medications Discontinued During This Encounter  Medication Reason  . amoxicillin (AMOXIL) 875 MG tablet Completed Course  . Ascorbic Acid (VITAMIN C) 1000 MG tablet Error  . B-12, Methylcobalamin, 1000 MCG SUBL Error  . Biotin 5000 MCG CAPS Error  . Cholecalciferol (VITAMIN D3) 2000 UNITS TABS Error  . Coenzyme Q10 (CO Q 10 PO) Error  . Flaxseed, Linseed, (FLAXSEED OIL) 1000 MG CAPS Error  . fluconazole (DIFLUCAN) 150 MG tablet Error  . milk thistle 175 MG tablet Error    Follow-up: No Follow-up on file.   Crecencio Mc, MD

## 2015-03-02 ENCOUNTER — Encounter: Payer: Self-pay | Admitting: Internal Medicine

## 2015-05-18 ENCOUNTER — Other Ambulatory Visit: Payer: Self-pay | Admitting: Internal Medicine

## 2015-05-18 ENCOUNTER — Encounter: Payer: Self-pay | Admitting: Internal Medicine

## 2015-05-18 DIAGNOSIS — Z1239 Encounter for other screening for malignant neoplasm of breast: Secondary | ICD-10-CM

## 2015-05-18 MED ORDER — PANTOPRAZOLE SODIUM 40 MG PO TBEC: 40.0000 mg | DELAYED_RELEASE_TABLET | Freq: Every day | ORAL | Status: DC

## 2015-05-20 ENCOUNTER — Encounter: Payer: Self-pay | Admitting: Internal Medicine

## 2015-05-20 ENCOUNTER — Other Ambulatory Visit: Payer: Self-pay | Admitting: Internal Medicine

## 2015-05-20 DIAGNOSIS — K2100 Gastro-esophageal reflux disease with esophagitis, without bleeding: Secondary | ICD-10-CM

## 2015-06-20 ENCOUNTER — Encounter: Payer: Self-pay | Admitting: *Deleted

## 2015-07-20 ENCOUNTER — Encounter: Payer: Self-pay | Admitting: Internal Medicine

## 2015-07-20 ENCOUNTER — Ambulatory Visit (INDEPENDENT_AMBULATORY_CARE_PROVIDER_SITE_OTHER): Payer: 59 | Admitting: Internal Medicine

## 2015-07-20 VITALS — BP 110/80 | HR 88 | Ht 59.5 in | Wt 142.0 lb

## 2015-07-20 DIAGNOSIS — R0989 Other specified symptoms and signs involving the circulatory and respiratory systems: Secondary | ICD-10-CM

## 2015-07-20 DIAGNOSIS — J387 Other diseases of larynx: Secondary | ICD-10-CM

## 2015-07-20 DIAGNOSIS — K219 Gastro-esophageal reflux disease without esophagitis: Secondary | ICD-10-CM | POA: Diagnosis not present

## 2015-07-20 DIAGNOSIS — F458 Other somatoform disorders: Secondary | ICD-10-CM

## 2015-07-20 DIAGNOSIS — K824 Cholesterolosis of gallbladder: Secondary | ICD-10-CM

## 2015-07-20 MED ORDER — PANTOPRAZOLE SODIUM 40 MG PO TBEC: 40.0000 mg | DELAYED_RELEASE_TABLET | Freq: Two times a day (BID) | ORAL | Status: DC

## 2015-07-20 NOTE — Progress Notes (Signed)
Patient ID: Leah Pearson, female   DOB: Jun 27, 1966, 49 y.o.   MRN: [id number] HPI: Leah Pearson is a 49 year old female with past medical history of GERD, chronic constipation, gallbladder polyps, migraines who seen in consultation at the request of Dr. Derrel Nip to evaluate GERD and globus sensation. She is here alone today.  She reports having developed GERD and reflux symptoms around 2004 initially treated with Protonix for some time. She was able to increase exercise and lose weight through diet change and symptoms seem to resolve. Over the last 6-7 months she's had recurrent GERD symptoms which for her is manifested primarily by globus sensation, hoarseness, throat clearing. She was having some substernal chest discomfort radiating to her ears. 2 months ago she was started back on pantoprazole 40 mg daily by primary care. Symptoms have improved dramatically but globus sensation has not completely resolved. Prior to starting pantoprazole she tried over-the-counter omeprazole 20 mg twice daily without benefit. She does notice postnasal drip and is using Sudafed. Appetite has been good. No nausea or vomiting. No early satiety. No dysphagia or odynophagia. Her weight has slowly increased by approximately 20 pounds over the last few years. Exercise recently has been limited by ankle surgery. 2 years ago she saw ENT which led to allergy evaluation. She had multiple environmental allergies but did not wish to pursue allergy shots. Bowel movements have been regular but she is using MiraLAX 17 g daily and occasional senna. Without that she reports constipation. She denies blood in her stool, melena, or change in bowel habits. No family history of colorectal cancer. Her father had pancreatic cancer. He also had colon polyps, though not at an early age. Paternal grandparent had stomach cancer.  She had a prior hysterectomy in 2011. She's also had multiple joint surgeries including ankle and back.   Past Medical  History  Diagnosis Date  . Migraines   . Allergy   . Inflammatory polyps     located in gall bladder  . Hx: UTI (urinary tract infection)   . GERD (gastroesophageal reflux disease)   . Arthritis     Past Surgical History  Procedure Laterality Date  . Abdominal hysterectomy  2011   . Cervix removal  2013  . Spinal fusion  1974  . Grise/green  1972  . Lowered arches  1975  . Great toe arthrodesis, jones procedure  1979  . Spinal fusion/eggshell/moll rods/herrington  1984  . Laparoscopy  2011    with hysterectomy  . Total ankle replacement Left 2015    Outpatient Prescriptions Prior to Visit  Medication Sig Dispense Refill  . Multiple Vitamins-Minerals (MULTIVITAMIN WITH MINERALS) tablet Take 1 tablet by mouth daily. EHT vitamin replacement    . Naproxen Sodium (ALEVE) 220 MG CAPS Take by mouth as needed.    . pantoprazole (PROTONIX) 40 MG tablet Take 1 tablet (40 mg total) by mouth daily. 30 tablet 3  . pseudoephedrine (SUDAFED) 120 MG 12 hr tablet Take 120 mg by mouth as needed for congestion.     No facility-administered medications prior to visit.    Allergies  Allergen Reactions  . Ferra-Caps [Iron] Rash    Family History  Problem Relation Age of Onset  . Diabetes Mother   . Heart disease Mother     valvular dz no replacement  . Heart disease Father   . Pancreatic cancer Father 35  . Stroke Maternal Grandmother   . Heart attack Maternal Grandfather   . Stomach cancer Paternal Grandmother   .  Colon polyps Father   . Irritable bowel syndrome Mother   . Gallbladder disease Mother     Social History  Substance Use Topics  . Smoking status: Never Smoker   . Smokeless tobacco: Never Used  . Alcohol Use: 4.2 oz/week    7 Glasses of wine per week    ROS: As per history of present illness, otherwise negative  BP 110/80 mmHg  Pulse 88  Ht 4' 11.5" (1.511 m)  Wt 142 lb (64.411 kg)  BMI 28.21 kg/m2 Constitutional: Well-developed and well-nourished. No  distress. HEENT: Normocephalic and atraumatic. Oropharynx is clear and moist. No oropharyngeal exudate. Conjunctivae are normal.  No scleral icterus. Neck: Neck supple. Trachea midline. Cardiovascular: Normal rate, regular rhythm and intact distal pulses. No M/R/G Pulmonary/chest: Effort normal and breath sounds normal. No wheezing, rales or rhonchi. Abdominal: Soft, nontender, nondistended. Bowel sounds active throughout. There are no masses palpable. No hepatosplenomegaly. Extremities: no clubbing, cyanosis, or edema Lymphadenopathy: No cervical adenopathy noted. Neurological: Alert and oriented to person place and time. Skin: Skin is warm and dry. No rashes noted. Psychiatric: Normal mood and affect. Behavior is normal.  RELEVANT LABS AND IMAGING: CBC    Component Value Date/Time   WBC 7.2 02/28/2015 1558   WBC 6.3 10/20/2013 0825   WBC 7.0 07/03/2012 1209   RBC 3.94 02/28/2015 1558   RBC 3.90 10/20/2013 0825   RBC 4.03 07/03/2012 1209   HGB 11.8* 02/28/2015 1558   HGB 12.5 07/03/2012 1209   HCT 34.9* 02/28/2015 1558   HCT 36.2 07/03/2012 1209   PLT 241.0 02/28/2015 1558   PLT 231 07/03/2012 1209   MCV 88.6 02/28/2015 1558   MCV 90 07/03/2012 1209   MCH 30.3 10/20/2013 0825   MCH 31.0 07/03/2012 1209   MCHC 33.8 02/28/2015 1558   MCHC 33.9 10/20/2013 0825   MCHC 34.5 07/03/2012 1209   RDW 14.0 02/28/2015 1558   RDW 13.3 10/20/2013 0825   RDW 13.0 07/03/2012 1209   LYMPHSABS 2.6 02/28/2015 1558   LYMPHSABS 2.9 10/20/2013 0825   MONOABS 0.4 02/28/2015 1558   EOSABS 0.1 02/28/2015 1558   EOSABS 0.1 10/20/2013 0825   BASOSABS 0.0 02/28/2015 1558   BASOSABS 0.1 10/20/2013 0825    CMP     Component Value Date/Time   NA 137 02/28/2015 1558   NA 141 10/20/2013 0825   NA 142 07/03/2012 1209   K 4.0 02/28/2015 1558   K 4.0 07/03/2012 1209   CL 104 02/28/2015 1558   CL 108* 07/03/2012 1209   CO2 25 02/28/2015 1558   CO2 23 07/03/2012 1209   GLUCOSE 93 02/28/2015  1558   GLUCOSE 80 10/20/2013 0825   GLUCOSE 84 07/03/2012 1209   BUN 12 02/28/2015 1558   BUN 15 10/20/2013 0825   BUN 11 07/03/2012 1209   CREATININE 0.65 02/28/2015 1558   CREATININE 0.66 07/03/2012 1209   CALCIUM 9.3 02/28/2015 1558   CALCIUM 8.8 07/03/2012 1209   PROT 7.4 02/28/2015 1558   PROT 6.4 10/20/2013 0825   ALBUMIN 4.0 02/28/2015 1558   ALBUMIN 4.2 10/20/2013 0825   AST 20 02/28/2015 1558   ALT 12 02/28/2015 1558   ALKPHOS 51 02/28/2015 1558   BILITOT 0.5 02/28/2015 1558   GFRNONAA 97 10/20/2013 0825   GFRNONAA >60 07/03/2012 1209   GFRAA 111 10/20/2013 0825   GFRAA >60 07/03/2012 1209    ASSESSMENT/PLAN: 49 year old female with past medical history of GERD, chronic constipation, gallbladder polyps, migraines who seen in consultation  at the request of Dr. Derrel Nip to evaluate GERD and globus sensation.   1. GERD with LPR symptoms -- we discussed reflux at length today. Her symptoms have responded to pantoprazole 40 mg once daily though not completely, particularly with globus sensation. She has had prior ENT evaluation. I recommended increasing pantoprazole to 40 mg twice a day 1 month. GERD diet has been recommended along with increased exercise and attempts at some minor weight reduction if possible. I would like her to begin Zyrtec 10 mg daily in place of Sudafed to try to help postnasal drip symptoms. I've asked that she notify me after one month and if any symptoms persist, specifically globus sensation, hoarseness, etc. then my recommendation is for upper endoscopy. She understands this recommendation.  2. Gallbladder polyps -- history of. Reportedly seen on abdominal ultrasound performed by gynecology and evaluation of uterine fibroids. This was around 2011. I recommended repeat ultrasound to assess for stability of these gallbladder polyps.  Return in 3 months, sooner if necessary    MC:[id number] Ether Griffins, Md [address] [place], [place]  [id number]

## 2015-07-20 NOTE — Patient Instructions (Addendum)
We have sent the following medications to your pharmacy for you to pick up at your convenience: Pantoprazole 40 mg twice daily x 1 month, then decreased back to once daily  Please purchase the following medications over the counter and take as directed: Zyrtec 10 mg by mouth once daily  Please discontinue sudafed.  You have been scheduled for an abdominal ultrasound at [facility] (1st floor of hospital) on 07/27/15 at 8:00 am. Please arrive 15 minutes prior to your appointment for registration. Make certain not to have anything to eat or drink 6 hours prior to your appointment. Should you need to reschedule your appointment, please contact radiology at [phone]. This test typically takes about 30 minutes to perform.  Follow a GERD diet (see information below).  Make sure to excercise and maintain a healthy diet.  Please follow up with Dr Hilarie Fredrickson in 3 months.  Call our office (or use mychart) to update [place] on how you are doing. If you continue to have symptoms on twice daily pantoprazole, we may proceed with upper endoscopy.  Food Choices for Gastroesophageal Reflux Disease, Adult When you have gastroesophageal reflux disease (GERD), the foods you eat and your eating habits are very important. Choosing the right foods can help ease the discomfort of GERD. WHAT GENERAL GUIDELINES DO I NEED TO FOLLOW?  Choose fruits, vegetables, whole grains, low-fat dairy products, and low-fat meat, fish, and poultry.  Limit fats such as oils, salad dressings, butter, nuts, and avocado.  Keep a food diary to identify foods that cause symptoms.  Avoid foods that cause reflux. These may be different for different people.  Eat frequent small meals instead of three large meals each day.  Eat your meals slowly, in a relaxed setting.  Limit fried foods.  Cook foods using methods other than frying.  Avoid drinking alcohol.  Avoid drinking large amounts of liquids with your meals.  Avoid  bending over or lying down until 2-3 hours after eating. WHAT FOODS ARE NOT RECOMMENDED? The following are some foods and drinks that may worsen your symptoms: Vegetables Tomatoes. Tomato juice. Tomato and spaghetti sauce. Chili peppers. Onion and garlic. Horseradish. Fruits Oranges, grapefruit, and lemon (fruit and juice). Meats High-fat meats, fish, and poultry. This includes hot dogs, ribs, ham, sausage, salami, and bacon. Dairy Whole milk and chocolate milk. Sour cream. Cream. Butter. Ice cream. Cream cheese.  Beverages Coffee and tea, with or without caffeine. Carbonated beverages or energy drinks. Condiments Hot sauce. Barbecue sauce.  Sweets/Desserts Chocolate and cocoa. Donuts. Peppermint and spearmint. Fats and Oils High-fat foods, including [place] fries and potato chips. Other Vinegar. Strong spices, such as black pepper, white pepper, red pepper, cayenne, curry powder, cloves, ginger, and chili powder. The items listed above may not be a complete list of foods and beverages to avoid. Contact your dietitian for more information.   This information is not intended to replace advice given to you by your health care provider. Make sure you discuss any questions you have with your health care provider.   Document Released: 09/24/2005 Document Revised: 10/15/2014 Document Reviewed: 07/29/2013 Elsevier Interactive Patient Education 2016 [employer].  Gastroesophageal Reflux Disease, Adult Normally, food travels down the esophagus and stays in the stomach to be digested. However, when a person has gastroesophageal reflux disease (GERD), food and stomach acid move back up into the esophagus. When this happens, the esophagus becomes sore and inflamed. Over time, GERD can create small holes (ulcers) in the lining of the esophagus.  CAUSES This condition is caused by a problem with the muscle between the esophagus and the stomach (lower esophageal sphincter, or LES). Normally, the LES  muscle closes after food passes through the esophagus to the stomach. When the LES is weakened or abnormal, it does not close properly, and that allows food and stomach acid to go back up into the esophagus. The LES can be weakened by certain dietary substances, medicines, and medical conditions, including:  Tobacco use.  Pregnancy.  Having a hiatal hernia.  Heavy alcohol use.  Certain foods and beverages, such as coffee, chocolate, onions, and peppermint. RISK FACTORS This condition is more likely to develop in:  People who have an increased body weight.  People who have connective tissue disorders.  People who use NSAID medicines. SYMPTOMS Symptoms of this condition include:  Heartburn.  Difficult or painful swallowing.  The feeling of having a lump in the throat.  Abitter taste in the mouth.  Bad breath.  Having a large amount of saliva.  Having an upset or bloated stomach.  Belching.  Chest pain.  Shortness of breath or wheezing.  Ongoing (chronic) cough or a night-time cough.  Wearing away of tooth enamel.  Weight loss. Different conditions can cause chest pain. Make sure to see your health care provider if you experience chest pain. DIAGNOSIS Your health care provider will take a medical history and perform a physical exam. To determine if you have mild or severe GERD, your health care provider may also monitor how you respond to treatment. You may also have other tests, including:  An endoscopy toexamine your stomach and esophagus with a small camera.  A test thatmeasures the acidity level in your esophagus.  A test thatmeasures how much pressure is on your esophagus.  A barium swallow or modified barium swallow to show the shape, size, and functioning of your esophagus. TREATMENT The goal of treatment is to help relieve your symptoms and to prevent complications. Treatment for this condition may vary depending on how severe your symptoms are.  Your health care provider may recommend:  Changes to your diet.  Medicine.  Surgery. HOME CARE INSTRUCTIONS Diet  Follow a diet as recommended by your health care provider. This may involve avoiding foods and drinks such as:  Coffee and tea (with or without caffeine).  Drinks that containalcohol.  Energy drinks and sports drinks.  Carbonated drinks or sodas.  Chocolate and cocoa.  Peppermint and mint flavorings.  Garlic and onions.  Horseradish.  Spicy and acidic foods, including peppers, chili powder, curry powder, vinegar, hot sauces, and barbecue sauce.  Citrus fruit juices and citrus fruits, such as oranges, lemons, and limes.  Tomato-based foods, such as red sauce, chili, salsa, and pizza with red sauce.  Fried and fatty foods, such as donuts, french fries, potato chips, and high-fat dressings.  High-fat meats, such as hot dogs and fatty cuts of red and white meats, such as rib eye steak, sausage, ham, and bacon.  High-fat dairy items, such as whole milk, butter, and cream cheese.  Eat small, frequent meals instead of large meals.  Avoid drinking large amounts of liquid with your meals.  Avoid eating meals during the 2-3 hours before bedtime.  Avoid lying down right after you eat.  Do not exercise right after you eat. General Instructions  Pay attention to any changes in your symptoms.  Take over-the-counter and prescription medicines only as told by your health care provider. Do not take aspirin, ibuprofen, or other NSAIDs  unless your health care provider told you to do so.  Do not use any tobacco products, including cigarettes, chewing tobacco, and e-cigarettes. If you need help quitting, ask your health care provider.  Wear loose-fitting clothing. Do not wear anything tight around your waist that causes pressure on your abdomen.  Raise (elevate) the head of your bed 6 inches (15cm).  Try to reduce your stress, such as with yoga or meditation. If  you need help reducing stress, ask your health care provider.  If you are overweight, reduce your weight to an amount that is healthy for you. Ask your health care provider for guidance about a safe weight loss goal.  Keep all follow-up visits as told by your health care provider. This is important. SEEK MEDICAL CARE IF:  You have new symptoms.  You have unexplained weight loss.  You have difficulty swallowing, or it hurts to swallow.  You have wheezing or a persistent cough.  Your symptoms do not improve with treatment.  You have a hoarse voice. SEEK IMMEDIATE MEDICAL CARE IF:  You have pain in your arms, neck, jaw, teeth, or back.  You feel sweaty, dizzy, or light-headed.  You have chest pain or shortness of breath.  You vomit and your vomit looks like blood or coffee grounds.  You faint.  Your stool is bloody or black.  You cannot swallow, drink, or eat.   This information is not intended to replace advice given to you by your health care provider. Make sure you discuss any questions you have with your health care provider.   Document Released: 07/04/2005 Document Revised: 06/15/2015 Document Reviewed: 01/19/2015 Elsevier Interactive Patient Education [employer].

## 2015-07-27 ENCOUNTER — Ambulatory Visit (HOSPITAL_COMMUNITY)
Admission: RE | Admit: 2015-07-27 | Discharge: 2015-07-27 | Disposition: A | Discharge status: Home / Self Care | Payer: 59 | Source: Ambulatory Visit | Attending: Internal Medicine | Admitting: Internal Medicine

## 2015-07-27 DIAGNOSIS — K824 Cholesterolosis of gallbladder: Secondary | ICD-10-CM | POA: Diagnosis not present

## 2015-09-06 ENCOUNTER — Encounter: Payer: Self-pay | Admitting: Internal Medicine

## 2015-09-07 ENCOUNTER — Other Ambulatory Visit: Payer: Self-pay

## 2015-09-07 MED ORDER — LANSOPRAZOLE 30 MG PO CPDR: 30.0000 mg | DELAYED_RELEASE_CAPSULE | Freq: Two times a day (BID) | ORAL | Status: DC

## 2015-09-07 NOTE — Telephone Encounter (Signed)
Patient email regarding reflux and globus sensation Some improvement though incomplete with twice a day pantoprazole Sometimes different PPIs work better for some patients. Discontinue pantoprazole trial of lansoprazole 30 mg twice a day before meals Given persistence of symptoms after trial of twice a day PPI would recommend upper endoscopy GERD diet precautions She should be congratulated on her weight loss. This will definitely help her reflux over time

## 2015-11-03 ENCOUNTER — Encounter: Payer: Self-pay | Admitting: Internal Medicine

## 2015-11-04 NOTE — Telephone Encounter (Signed)
Given persistent globus would arrange EGD  Change pantoprazole 40 mg twice a day to Dexilant 60 mg once daily see if this helps or is more effective for her

## 2015-11-07 ENCOUNTER — Other Ambulatory Visit: Payer: Self-pay

## 2015-11-07 MED ORDER — DEXLANSOPRAZOLE 60 MG PO CPDR: 60.0000 mg | DELAYED_RELEASE_CAPSULE | Freq: Every day | ORAL | Status: DC

## 2015-11-15 ENCOUNTER — Encounter: Payer: Self-pay | Admitting: Internal Medicine

## 2015-11-15 NOTE — Telephone Encounter (Signed)
Patient can come direct to EGD without the need for follow-up appointment with me first

## 2015-11-16 NOTE — Telephone Encounter (Signed)
Dustin please schedule pt for direct EGD. See note from Dr. Hilarie Fredrickson below.

## 2015-11-17 ENCOUNTER — Encounter: Payer: Self-pay | Admitting: Internal Medicine

## 2015-11-29 ENCOUNTER — Ambulatory Visit (AMBULATORY_SURGERY_CENTER): Payer: Self-pay

## 2015-11-29 VITALS — Ht 60.0 in | Wt 145.4 lb

## 2015-11-29 DIAGNOSIS — K2100 Gastro-esophageal reflux disease with esophagitis, without bleeding: Secondary | ICD-10-CM

## 2015-11-29 NOTE — Progress Notes (Signed)
Per pt, no allergies to soy or egg products.Pt not taking any weight loss meds or using  O2 at home. 

## 2015-12-05 ENCOUNTER — Ambulatory Visit (AMBULATORY_SURGERY_CENTER): Payer: 59 | Admitting: Internal Medicine

## 2015-12-05 ENCOUNTER — Encounter: Payer: Self-pay | Admitting: Internal Medicine

## 2015-12-05 VITALS — BP 130/87 | HR 70 | Temp 98.3°F | Resp 14 | Ht 60.0 in | Wt 145.0 lb

## 2015-12-05 DIAGNOSIS — K269 Duodenal ulcer, unspecified as acute or chronic, without hemorrhage or perforation: Secondary | ICD-10-CM

## 2015-12-05 DIAGNOSIS — F458 Other somatoform disorders: Secondary | ICD-10-CM

## 2015-12-05 DIAGNOSIS — J387 Other diseases of larynx: Secondary | ICD-10-CM | POA: Diagnosis not present

## 2015-12-05 DIAGNOSIS — K2100 Gastro-esophageal reflux disease with esophagitis, without bleeding: Secondary | ICD-10-CM

## 2015-12-05 DIAGNOSIS — R0989 Other specified symptoms and signs involving the circulatory and respiratory systems: Secondary | ICD-10-CM

## 2015-12-05 DIAGNOSIS — K219 Gastro-esophageal reflux disease without esophagitis: Secondary | ICD-10-CM

## 2015-12-05 MED ORDER — SODIUM CHLORIDE 0.9 % IV SOLN: 500.0000 mL | INTRAVENOUS | Status: DC

## 2015-12-05 NOTE — Progress Notes (Signed)
Called to room to assist during endoscopic procedure.  Patient ID and intended procedure confirmed with present staff. Received instructions for my participation in the procedure from the performing physician.  

## 2015-12-05 NOTE — Patient Instructions (Signed)
Impressions/recommendations:  Small, scattered erosions found in the 2nd part of the duodenum. Await biopsy results.  YOU HAD AN ENDOSCOPIC PROCEDURE TODAY AT [facility]:   Refer to the procedure report that was given to you for any specific questions about what was found during the examination.  If the procedure report does not answer your questions, please call your gastroenterologist to clarify.  If you requested that your care partner not be given the details of your procedure findings, then the procedure report has been included in a sealed envelope for you to review at your convenience later.  YOU SHOULD EXPECT: Some feelings of bloating in the abdomen. Passage of more gas than usual.  Walking can help get rid of the air that was put into your GI tract during the procedure and reduce the bloating. If you had a lower endoscopy (such as a colonoscopy or flexible sigmoidoscopy) you may notice spotting of blood in your stool or on the toilet paper. If you underwent a bowel prep for your procedure, you may not have a normal bowel movement for a few days.  Please Note:  You might notice some irritation and congestion in your nose or some drainage.  This is from the oxygen used during your procedure.  There is no need for concern and it should clear up in a day or so.  SYMPTOMS TO REPORT IMMEDIATELY:   Following upper endoscopy (EGD)  Vomiting of blood or coffee ground material  New chest pain or pain under the shoulder blades  Painful or persistently difficult swallowing  New shortness of breath  Fever of 100F or higher  Black, tarry-looking stools  For urgent or emergent issues, a gastroenterologist can be reached at any hour by calling [phone].   DIET: Your first meal following the procedure should be a small meal and then it is ok to progress to your normal diet. Heavy or fried foods are harder to digest and may make you feel nauseous or bloated.  Likewise, meals  heavy in dairy and vegetables can increase bloating.  Drink plenty of fluids but you should avoid alcoholic beverages for 24 hours.  ACTIVITY:  You should plan to take it easy for the rest of today and you should NOT DRIVE or use heavy machinery until tomorrow (because of the sedation medicines used during the test).    FOLLOW UP: Our staff will call the number listed on your records the next business day following your procedure to check on you and address any questions or concerns that you may have regarding the information given to you following your procedure. If we do not reach you, we will leave a message.  However, if you are feeling well and you are not experiencing any problems, there is no need to return our call.  We will assume that you have returned to your regular daily activities without incident.  If any biopsies were taken you will be contacted by phone or by letter within the next 1-3 weeks.  Please call us at [phone] if you have not heard about the biopsies in 3 weeks.    SIGNATURES/CONFIDENTIALITY: You and/or your care partner have signed paperwork which will be entered into your electronic medical record.  These signatures attest to the fact that that the information above on your After Visit Summary has been reviewed and is understood.  Full responsibility of the confidentiality of this discharge information lies with you and/or your care-partner.

## 2015-12-05 NOTE — Progress Notes (Signed)
To recovery, report to [place], RN, VSS 

## 2015-12-05 NOTE — Op Note (Signed)
[facility]  [address]. [place], [id number]   ENDOSCOPY PROCEDURE REPORT  PATIENT: Leah Pearson, Leah Pearson  MR#: [id number] BIRTHDATE: 03-08-1966 , 86  yrs. old GENDER: female ENDOSCOPIST: Jerene Bears, MD PROCEDURE DATE:  12/05/2015 PROCEDURE:  EGD, diagnostic and EGD w/ biopsy ASA CLASS:     Class II INDICATIONS:  globus sensation, LPR symptoms. MEDICATIONS: Monitored anesthesia care and Propofol 200 mg IV TOPICAL ANESTHETIC: none  DESCRIPTION OF PROCEDURE: After the risks benefits and alternatives of the procedure were thoroughly explained, informed consent was obtained.  The LB [id number] endoscope was introduced through the mouth and advanced to the second portion of the duodenum , Without limitations.  The instrument was slowly withdrawn as the mucosa was fully examined.   ESOPHAGUS: The mucosa of the esophagus appeared normal.  No evidence of stricture.  Biopsies were taken in the mid and distal esophagus for eosinophilic esophagitis.  STOMACH: The mucosa of the stomach appeared normal.  DUODENUM: Multiple small, scattered, round and linear erosions were found in the 2nd part of the duodenum.  Query NSAID related. Multiple biopsies was performed using cold forceps.  Normal duodenal bulb.  Retroflexed views revealed no abnormalities.     The scope was then withdrawn from the patient and the procedure completed.  COMPLICATIONS: There were no immediate complications.      ENDOSCOPIC IMPRESSION: 1.   The mucosa of the esophagus appeared normal; biopsies were taken in the mid and distal esophagus for eosinophilic esophagitis 2.   The mucosa of the stomach appeared normal 3.   Small, scattered, erosions were found in the 2nd part of the duodenum; multiple biopsies was performed  RECOMMENDATIONS: 1.  Await biopsy results 2.  If biopsy result negative, consider ENT evaluation and CT scan of the neck 3.  Office follow-up next available (discuss  globus sensation and rediscuss reflux, consideration of 24 hr pH and impedance testing)  eSigned:  Jerene Bears, MD 12/05/2015 10:13 AM    CC: the patient, Dr. Derrel Nip  PATIENT NAME:  [name], [name] MR#: [id number]

## 2015-12-09 ENCOUNTER — Encounter: Payer: Self-pay | Admitting: Internal Medicine

## 2015-12-12 ENCOUNTER — Encounter: Payer: Self-pay | Admitting: Internal Medicine

## 2015-12-12 NOTE — Telephone Encounter (Signed)
I am okay with H pylori breath testing, certainly would not hurt. I did consider the CT neck, but decided on ENT referral 1st.  I would advise that she ask ENT about the need for CT neck. I hope symptoms get better soon.

## 2015-12-23 ENCOUNTER — Other Ambulatory Visit: Payer: Self-pay | Admitting: Internal Medicine

## 2015-12-26 ENCOUNTER — Ambulatory Visit: Payer: 59 | Admitting: Internal Medicine

## 2015-12-26 LAB — H. PYLORI BREATH TEST: H. pylori UBiT: NEGATIVE

## 2016-01-17 ENCOUNTER — Encounter: Payer: Self-pay | Admitting: *Deleted

## 2016-01-30 ENCOUNTER — Encounter: Payer: Self-pay | Admitting: Internal Medicine

## 2016-01-30 ENCOUNTER — Ambulatory Visit (INDEPENDENT_AMBULATORY_CARE_PROVIDER_SITE_OTHER): Payer: 59 | Admitting: Internal Medicine

## 2016-01-30 VITALS — BP 108/80 | HR 92 | Ht 59.5 in | Wt 141.5 lb

## 2016-01-30 DIAGNOSIS — K5900 Constipation, unspecified: Secondary | ICD-10-CM | POA: Diagnosis not present

## 2016-01-30 DIAGNOSIS — R0989 Other specified symptoms and signs involving the circulatory and respiratory systems: Secondary | ICD-10-CM

## 2016-01-30 DIAGNOSIS — K219 Gastro-esophageal reflux disease without esophagitis: Secondary | ICD-10-CM

## 2016-01-30 DIAGNOSIS — K5909 Other constipation: Secondary | ICD-10-CM

## 2016-01-30 DIAGNOSIS — F458 Other somatoform disorders: Secondary | ICD-10-CM | POA: Diagnosis not present

## 2016-01-30 MED ORDER — LANSOPRAZOLE 30 MG PO CPDR: 30.0000 mg | DELAYED_RELEASE_CAPSULE | Freq: Every day | ORAL | Status: DC

## 2016-01-30 NOTE — Patient Instructions (Signed)
We have sent the following medications to your pharmacy for you to pick up at your convenience: Lansoprazole 30 mg daily  Please follow up with Dr Hilarie Fredrickson in 1 year.  If you are age 48 or older, your body mass index should be between 23-30. Your Body mass index is 28.11 kg/(m^2). If this is out of the aforementioned range listed, please consider follow up with your Primary Care Provider.  If you are age 70 or younger, your body mass index should be between 19-25. Your Body mass index is 28.11 kg/(m^2). If this is out of the aformentioned range listed, please consider follow up with your Primary Care Provider.

## 2016-01-30 NOTE — Progress Notes (Signed)
   Subjective:    Patient ID: Leah Pearson, female    DOB: 11-May-1966, 50 y.o.   MRN: [id number]  HPI Espn Whritenour is a 50 year old female with history of GERD with globus sensation, chronic constipation, stable gallbladder polyps and migraines who is here for follow-up. She came for upper endoscopy to evaluate persistent reflux symptoms despite PPI on 12/05/2015. This showed normal esophagus, normal stomach and several erosions in the second portion of the duodenum. These were biopsied. Biopsies were benign both in the esophagus and small bowel. No evidence of celiac disease. No evidence of eosinophilic esophagitis. She tried Dexilant but it didn't help as much as previously prescribed lansoprazole. She's taking lansoprazole 30 mg daily. She was sent to Dr. Constance Holster with ENT to evaluate persistent globus. Direct laryngoscopy was reportedly unremarkable except for mild inflammation. He made the recommendation to avoid alcohol, caffeine, chocolate and peppermint. She has made these dietary changes and reports dramatic improvement in globus sensation. She is also not had hoarseness and her seeing voice has returned. She denies chest pain. No issues with heartburn recently. No abdominal pain. Bowel movements are regular as long as she is using senna. She uses 3 tablets one day and to the next. No blood in her stool or melena   Review of Systems As per history of present illness, otherwise negative  Current Medications, Allergies, Past Medical History, Past Surgical History, Family History and Social History were reviewed in [employer] record.     Objective:   Physical Exam BP 108/80 mmHg  Pulse 92  Ht 4' 11.5" (1.511 m)  Wt 141 lb 8 oz (64.184 kg)  BMI 28.11 kg/m2 Constitutional: Well-developed and well-nourished. No distress. HEENT: Normocephalic and atraumatic. Oropharynx is clear and moist. No oropharyngeal exudate. Conjunctivae are normal.  No scleral icterus. Neck: Neck  supple. Trachea midline. Cardiovascular: Normal rate, regular rhythm and intact distal pulses. No M/R/G Pulmonary/chest: Effort normal and breath sounds normal. No wheezing, rales or rhonchi. Abdominal: Soft, nontender, nondistended. Bowel sounds active throughout. Extremities: no clubbing, cyanosis, or edema Neurological: Alert and oriented to person place and time. Skin: Skin is warm and dry.  Psychiatric: Normal mood and affect. Behavior is normal.     Assessment & Plan:  50 year old female with history of GERD with globus sensation, chronic constipation, stable gallbladder polyps and migraines who is here for follow-up.  1. GERD and LPR -- improved with dietary modification and lansoprazole. Continue lansoprazole 30 mg daily. Continue to avoid trigger foods such as alcohol, caffeine, chocolate and peppermint. 1 year follow-up  2. Chronic constipation -- continue senna which is currently working well  3. Gallbladder polyps -- stable, subcentimeter no follow-up required.  One year follow-up, sooner if necessary 15 minutes spent with the patient today. Greater than 50% was spent in counseling and coordination of care with the patient

## 2016-04-09 ENCOUNTER — Encounter: Payer: Self-pay | Admitting: Internal Medicine

## 2016-04-11 ENCOUNTER — Ambulatory Visit (INDEPENDENT_AMBULATORY_CARE_PROVIDER_SITE_OTHER): Payer: 59 | Admitting: Internal Medicine

## 2016-04-11 ENCOUNTER — Encounter: Payer: Self-pay | Admitting: Internal Medicine

## 2016-04-11 VITALS — BP 116/76 | HR 76 | Temp 98.1°F | Resp 12 | Ht 60.0 in | Wt 142.2 lb

## 2016-04-11 DIAGNOSIS — K5901 Slow transit constipation: Secondary | ICD-10-CM

## 2016-04-11 DIAGNOSIS — E559 Vitamin D deficiency, unspecified: Secondary | ICD-10-CM | POA: Diagnosis not present

## 2016-04-11 DIAGNOSIS — E785 Hyperlipidemia, unspecified: Secondary | ICD-10-CM | POA: Diagnosis not present

## 2016-04-11 DIAGNOSIS — R5383 Other fatigue: Secondary | ICD-10-CM

## 2016-04-11 DIAGNOSIS — Z1239 Encounter for other screening for malignant neoplasm of breast: Secondary | ICD-10-CM

## 2016-04-11 DIAGNOSIS — Z0000 Encounter for general adult medical examination without abnormal findings: Secondary | ICD-10-CM | POA: Diagnosis not present

## 2016-04-11 MED ORDER — AMOXICILLIN 500 MG PO CAPS: ORAL_CAPSULE | ORAL | Status: DC

## 2016-04-11 NOTE — Patient Instructions (Signed)

## 2016-04-11 NOTE — Progress Notes (Signed)
Pre-visit discussion using our clinic review tool. No additional management support is needed unless otherwise documented below in the visit note.  

## 2016-04-11 NOTE — Progress Notes (Signed)
Patient ID: Leah Pearson, female    DOB: 04-Mar-1966  Age: 50 y.o. MRN: [id number]  The patient is here for annual Medicare wellness examination and management of other chronic and acute problems.    SOME ANKLE PAIN ON LEFT SINCE REPLACEMENT  Globus and hoarseness evaluated in Feb  Endoscopy normal , GERD diet recommended,  Which has helped   THE RISK factors are reflected in the social history.  The roster of all physicians providing medical care to patient - is listed in the Snapshot section of the chart.   Home safety : The patient has smoke detectors in the home. They wear seatbelts.  There are no firearms at home. There is no violence in the home.   There is no risks for hepatitis, STDs or HIV. There is no   history of blood transfusion. They have no travel history to infectious disease endemic areas of the world.  The patient has been postponing seeing her dentist in the last six month. They have seen their eye doctor in the last year.   They do not  have excessive sun exposure. Discussed the need for sun protection: hats, long sleeves and use of sunscreen if there is significant sun exposure.   Diet: the importance of a healthy diet is discussed. They do have a healthy diet.  The benefits of regular aerobic exercise were discussed. She walks 4 times per week ,  20 minutes.   Depression screen: there are no signs or vegative symptoms of depression- irritability, change in appetite, anhedonia, sadness/tearfullness. .  The following portions of the patient's history were reviewed and updated as appropriate: allergies, current medications, past family history, past medical history,  past surgical history, past social history  and problem list.  Visual acuity was not assessed per patient preference since she has regular follow up with her ophthalmologist. Hearing and body mass index were assessed and reviewed.   During the course of the visit the patient was educated and counseled  about appropriate screening and preventive services including : fall prevention , diabetes screening, nutrition counseling, colorectal cancer screening, and recommended immunizations.    CC: The primary encounter diagnosis was Breast cancer screening. Diagnoses of Hyperlipidemia, Vitamin D deficiency, Other fatigue, Visit for preventive health examination, and Constipation by delayed colonic transit were also pertinent to this visit.  History Leah Pearson has a past medical history of Migraines; Allergy; Inflammatory polyps; UTI (urinary tract infection); GERD (gastroesophageal reflux disease); Arthritis; and Spina bifida ([place]).   She has past surgical history that includes Abdominal hysterectomy (2011 ); Cervix removal (2013); Spinal fusion (1974); grise/green (1972); lowered arches (1975); Great toe arthrodesis, Jones procedure (1979); spinal fusion/eggshell/moll rods/herrington (1984); laparoscopy (2011); and Total ankle replacement (Left, 2015).   Her family history includes Brain cancer in her sister; Colon polyps in her father; Diabetes in her mother; Gallbladder disease in her mother; Heart attack in her maternal grandfather; Heart disease in her father and mother; Irritable bowel syndrome in her mother; Pancreatic cancer (age of onset: 43) in her father; Stomach cancer in her paternal grandmother; Stroke in her maternal grandmother. There is no history of Colon cancer, Esophageal cancer, or Rectal cancer.She reports that she has never smoked. She has never used smokeless tobacco. She reports that she drinks about 1.8 - 2.4 oz of alcohol per week. She reports that she does not use illicit drugs.  Outpatient Prescriptions Prior to Visit  Medication Sig Dispense Refill  . Ca & Phos-Vit D-Mag (CALCIUM) [id number] TABS Take  by mouth daily.    . cholecalciferol (VITAMIN D) 1000 units tablet Take 1,000 Units by mouth daily.    . Coenzyme Q10 (COQ10) 100 MG CAPS Take by mouth daily.    . lansoprazole  (PREVACID) 30 MG capsule Take 1 capsule (30 mg total) by mouth daily. 30 capsule 10  . Multiple Vitamins-Minerals (MULTIVITAMIN WITH MINERALS) tablet Take 1 tablet by mouth daily. EHT vitamin replacement    . Naproxen Sodium (ALEVE) 220 MG CAPS Take by mouth as needed. Reported on 11/29/2015    . NON FORMULARY Gareinia Cambogia 800 mg -Take 2 a day    . pyridoxine (B-6) 100 MG tablet Take 100 mg by mouth daily.    Marland Kitchen senna (SENOKOT) 8.6 MG tablet Take 2 tablets by mouth daily.     No facility-administered medications prior to visit.    Review of Systems   Patient denies headache, fevers, malaise, unintentional weight loss, skin rash, eye pain, sinus congestion and sinus pain, sore throat, dysphagia,  hemoptysis , cough, dyspnea, wheezing, chest pain, palpitations, orthopnea, edema, abdominal pain, nausea, melena, diarrhea, constipation, flank pain, dysuria, hematuria, urinary  Frequency, nocturia, numbness, tingling, seizures,  Focal weakness, Loss of consciousness,  Tremor, insomnia, depression, anxiety, and suicidal ideation.      Objective:  BP 116/76 mmHg  Pulse 76  Temp(Src) 98.1 F (36.7 C) (Oral)  Resp 12  Ht 5' (1.524 m)  Wt 142 lb 4 oz (64.524 kg)  BMI 27.78 kg/m2  SpO2 97%  Physical Exam   General appearance: alert, cooperative and appears stated age Head: Normocephalic, without obvious abnormality, atraumatic Eyes: conjunctivae/corneas clear. PERRL, EOM's intact. Fundi benign. Ears: normal TM's and external ear canals both ears Nose: Nares normal. Septum midline. Mucosa normal. No drainage or sinus tenderness. Throat: lips, mucosa, and tongue normal; teeth and gums normal Neck: no adenopathy, no carotid bruit, no JVD, supple, symmetrical, trachea midline and thyroid not enlarged, symmetric, no tenderness/mass/nodules Lungs: clear to auscultation bilaterally Breasts: normal appearance, no masses or tenderness Heart: regular rate and rhythm, S1, S2 normal, no murmur,  click, rub or gallop Abdomen: soft, non-tender; bowel sounds normal; no masses,  no organomegaly Extremities: extremities normal, atraumatic, no cyanosis or edema Pulses: 2+ and symmetric Skin: Skin color, texture, turgor normal. No rashes or lesions Neurologic: Alert and oriented X 3, normal strength and tone. Normal symmetric reflexes. Normal coordination and gait.       Assessment & Plan:   Problem List Items Addressed This Visit    Constipation by delayed colonic transit    Now managed with 3 sennakot daily       Visit for preventive health examination    Annual comprehensive preventive exam was done as well as an evaluation and management of chronic conditions .  During the course of the visit the patient was educated and counseled about appropriate screening and preventive services including :  diabetes screening, lipid analysis with projected  10 year  risk for CAD , nutrition counseling, breast, cervical and colorectal cancer screening, and recommended immunizations.  Printed recommendations for health maintenance screenings was given       Other Visit Diagnoses    Breast cancer screening    -  Primary    Relevant Orders    MM DIGITAL SCREENING BILATERAL    Hyperlipidemia        Relevant Orders    Lipid panel    Vitamin D deficiency        Relevant Orders  VITAMIN D 25 Hydroxy (Vit-D Deficiency, Fractures)    Other fatigue        Relevant Orders    Comprehensive metabolic panel    HIV antibody    TSH    CBC with Differential/Platelet       I am having Ms. Kosakowski start on amoxicillin. I am also having her maintain her Naproxen Sodium, multivitamin with minerals, cholecalciferol, Calcium, CoQ10, pyridoxine, senna, NON FORMULARY, and lansoprazole.  Meds ordered this encounter  Medications  . amoxicillin (AMOXIL) 500 MG capsule    Sig: Take 4 capsules 2 hours prior to procedure    Dispense:  4 capsule    Refill:  0    There are no discontinued  medications.  Follow-up: Return fasting labs asap .   Crecencio Mc, MD

## 2016-04-12 ENCOUNTER — Other Ambulatory Visit (INDEPENDENT_AMBULATORY_CARE_PROVIDER_SITE_OTHER): Payer: 59

## 2016-04-12 DIAGNOSIS — R5383 Other fatigue: Secondary | ICD-10-CM

## 2016-04-12 DIAGNOSIS — E559 Vitamin D deficiency, unspecified: Secondary | ICD-10-CM

## 2016-04-12 DIAGNOSIS — E785 Hyperlipidemia, unspecified: Secondary | ICD-10-CM

## 2016-04-13 LAB — COMPREHENSIVE METABOLIC PANEL
ALBUMIN: 4.2 g/dL (ref 3.5–5.5)
ALT: 10 IU/L (ref 0–32)
AST: 16 IU/L (ref 0–40)
Albumin/Globulin Ratio: 1.4 (ref 1.2–2.2)
Alkaline Phosphatase: 45 IU/L (ref 39–117)
BUN / CREAT RATIO: 26 — AB (ref 9–23)
BUN: 21 mg/dL (ref 6–24)
Bilirubin Total: 0.4 mg/dL (ref 0.0–1.2)
CO2: 21 mmol/L (ref 18–29)
CREATININE: 0.81 mg/dL (ref 0.57–1.00)
Calcium: 9 mg/dL (ref 8.7–10.2)
Chloride: 104 mmol/L (ref 96–106)
GFR calc Af Amer: 99 mL/min/{1.73_m2} (ref >59)
GFR, EST NON AFRICAN AMERICAN: 86 mL/min/{1.73_m2} (ref >59)
GLOBULIN, TOTAL: 2.9 g/dL (ref 1.5–4.5)
GLUCOSE: 89 mg/dL (ref 65–99)
Potassium: 4.9 mmol/L (ref 3.5–5.2)
SODIUM: 142 mmol/L (ref 134–144)
Total Protein: 7.1 g/dL (ref 6.0–8.5)

## 2016-04-13 LAB — CBC WITH DIFFERENTIAL/PLATELET
BASOS: 1 %
Basophils Absolute: 0.1 10*3/uL (ref 0.0–0.2)
EOS (ABSOLUTE): 0.1 10*3/uL (ref 0.0–0.4)
EOS: 1 %
HEMATOCRIT: 36.9 % (ref 34.0–46.6)
HEMOGLOBIN: 12.4 g/dL (ref 11.1–15.9)
IMMATURE GRANULOCYTES: 0 %
Immature Grans (Abs): 0 10*3/uL (ref 0.0–0.1)
LYMPHS ABS: 2.3 10*3/uL (ref 0.7–3.1)
Lymphs: 37 %
MCH: 30.3 pg (ref 26.6–33.0)
MCHC: 33.6 g/dL (ref 31.5–35.7)
MCV: 90 fL (ref 79–97)
MONOCYTES: 8 %
MONOS ABS: 0.5 10*3/uL (ref 0.1–0.9)
NEUTROS PCT: 53 %
Neutrophils Absolute: 3.3 10*3/uL (ref 1.4–7.0)
Platelets: 276 10*3/uL (ref 150–379)
RBC: 4.09 x10E6/uL (ref 3.77–5.28)
RDW: 13.4 % (ref 12.3–15.4)
WBC: 6.2 10*3/uL (ref 3.4–10.8)

## 2016-04-13 LAB — LIPID PANEL
CHOLESTEROL TOTAL: 208 mg/dL — AB (ref 100–199)
Chol/HDL Ratio: 2.5 ratio units (ref 0.0–4.4)
HDL: 84 mg/dL (ref >39)
LDL CALC: 109 mg/dL — AB (ref 0–99)
TRIGLYCERIDES: 76 mg/dL (ref 0–149)
VLDL CHOLESTEROL CAL: 15 mg/dL (ref 5–40)

## 2016-04-13 LAB — TSH: TSH: 3.95 u[IU]/mL (ref 0.450–4.500)

## 2016-04-13 LAB — VITAMIN D 25 HYDROXY (VIT D DEFICIENCY, FRACTURES): Vit D, 25-Hydroxy: 26.3 ng/mL — ABNORMAL LOW (ref 30.0–100.0)

## 2016-04-13 LAB — HIV ANTIBODY (ROUTINE TESTING W REFLEX): HIV Screen 4th Generation wRfx: NONREACTIVE

## 2016-04-14 ENCOUNTER — Encounter: Payer: Self-pay | Admitting: Internal Medicine

## 2016-04-14 NOTE — Assessment & Plan Note (Signed)
Annual comprehensive preventive exam was done as well as an evaluation and management of chronic conditions .  During the course of the visit the patient was educated and counseled about appropriate screening and preventive services including :  diabetes screening, lipid analysis with projected  10 year  risk for CAD , nutrition counseling, breast, cervical and colorectal cancer screening, and recommended immunizations.  Printed recommendations for health maintenance screenings was given 

## 2016-04-14 NOTE — Assessment & Plan Note (Signed)
Now managed with 3 sennakot daily  

## 2016-04-24 ENCOUNTER — Ambulatory Visit
Admission: RE | Admit: 2016-04-24 | Discharge: 2016-04-24 | Disposition: A | Discharge status: Home / Self Care | Payer: 59 | Source: Ambulatory Visit | Attending: Internal Medicine | Admitting: Internal Medicine

## 2016-04-24 ENCOUNTER — Other Ambulatory Visit: Payer: Self-pay | Admitting: Internal Medicine

## 2016-04-24 DIAGNOSIS — Z1239 Encounter for other screening for malignant neoplasm of breast: Secondary | ICD-10-CM

## 2016-04-24 DIAGNOSIS — Z1231 Encounter for screening mammogram for malignant neoplasm of breast: Secondary | ICD-10-CM | POA: Insufficient documentation

## 2016-05-01 ENCOUNTER — Encounter: Payer: Self-pay | Admitting: Internal Medicine

## 2017-02-12 ENCOUNTER — Other Ambulatory Visit: Payer: Self-pay | Admitting: Internal Medicine

## 2018-04-15 ENCOUNTER — Encounter: Payer: Self-pay | Admitting: Internal Medicine

## 2018-04-16 ENCOUNTER — Telehealth: Payer: Self-pay | Admitting: Internal Medicine

## 2018-04-16 NOTE — Telephone Encounter (Signed)
Called pt to set up an appt, lm on vm.

## 2018-05-09 LAB — BASIC METABOLIC PANEL
Creatinine: 1 (ref 0.5–1.1)
GLUCOSE: 90

## 2018-05-09 LAB — LIPID PANEL
Cholesterol: 247 — AB (ref 0–200)
HDL: 107 — AB (ref 35–70)
LDL Cholesterol: 130
Triglycerides: 52 (ref 40–160)

## 2018-05-09 LAB — HEMOGLOBIN A1C: HEMOGLOBIN A1C: 5.4

## 2018-06-24 ENCOUNTER — Ambulatory Visit (INDEPENDENT_AMBULATORY_CARE_PROVIDER_SITE_OTHER): Admitting: Internal Medicine

## 2018-06-24 ENCOUNTER — Encounter: Payer: Self-pay | Admitting: Internal Medicine

## 2018-06-24 VITALS — BP 122/82 | HR 82 | Temp 98.6°F | Resp 15 | Ht 60.0 in | Wt 141.4 lb

## 2018-06-24 DIAGNOSIS — Z1239 Encounter for other screening for malignant neoplasm of breast: Secondary | ICD-10-CM

## 2018-06-24 DIAGNOSIS — Z1231 Encounter for screening mammogram for malignant neoplasm of breast: Secondary | ICD-10-CM | POA: Diagnosis not present

## 2018-06-24 DIAGNOSIS — K5901 Slow transit constipation: Secondary | ICD-10-CM

## 2018-06-24 DIAGNOSIS — E663 Overweight: Secondary | ICD-10-CM | POA: Diagnosis not present

## 2018-06-24 DIAGNOSIS — K2100 Gastro-esophageal reflux disease with esophagitis, without bleeding: Secondary | ICD-10-CM

## 2018-06-24 DIAGNOSIS — L309 Dermatitis, unspecified: Secondary | ICD-10-CM | POA: Diagnosis not present

## 2018-06-24 DIAGNOSIS — Z0000 Encounter for general adult medical examination without abnormal findings: Secondary | ICD-10-CM | POA: Diagnosis not present

## 2018-06-24 DIAGNOSIS — Z1211 Encounter for screening for malignant neoplasm of colon: Secondary | ICD-10-CM | POA: Diagnosis not present

## 2018-06-24 MED ORDER — LANSOPRAZOLE 30 MG PO CPDR: 30.0000 mg | DELAYED_RELEASE_CAPSULE | Freq: Every day | ORAL | 10 refills | Status: DC

## 2018-06-24 NOTE — Patient Instructions (Addendum)
You might want to try a premixed protein drink called Premier Protein shake for breakfast or late night snack . It is great tasting,   very low sugar and available of < $2 serving at [facility] and  In bulk for $1.50/serving at [employer] and [employer]  .    Nutritional analysis :  160 cal  30 g protein  1 g sugar 50% calcium needs   [facility]lad[facility] Faster and [employer]  Your mammogram has been ordered.  You can call to make your appointment at [facility]   Your cholesterol is fine,  You have an excellent HDL !   Your right hand is suffering from eczema.  A good quality moisturizer applied frequently should help reduce the cracking, and  I can call in a steroid cream to use when it flares    Hand Dermatitis Hand dermatitis is a skin condition that causes small, itchy, raised dots or fluid-filled blisters to form over the palms of the hands. This condition may also be called hand eczema. What are the causes? The cause of this condition is not known. What increases the risk? This condition is more likely to develop in people who have a history of allergies, such as:  Hay fever.  Allergic asthma.  An allergy to latex.  Chemical exposure, injuries, and environmental irritants can make hand dermatitis worse. Washing your hands too often can remove natural oils, which can dry out the skin and contribute to outbreaks of this condition. What are the signs or symptoms? The most common symptom of this condition is intense itchiness. Cracks or grooves (fissures) on the fingers can also develop. Affected areas can be painful, especially areas where large blisters have formed. How is this diagnosed? This condition is diagnosed with a medical history and physical exam. How is this treated? This condition is treated with medicines, including:  Steroid creams and ointments.  Oral steroid medicines.  Antibiotic medicines. These are prescribed if you have an infection.  Antihistamine medicines. These help  to reduce itchiness.  Follow these instructions at home:  Take or apply over-the-counter and prescription medicines only as told by your health care provider.  If you were prescribed an antibiotic medicine, use it as told by your health care provider. Do not stop using the antibiotic even if you start to feel better.  Avoid washing your hands more often than necessary.  Avoid using harsh chemicals on your hands.  Wear protective gloves when you handle products that can irritate your skin.  Keep all follow-up visits as told by your health care provider. This is important. Contact a health care provider if:  Your rash does not improve during the first week of treatment.  Your rash is red or tender.  Your rash has pus coming from it.  Your rash spreads. This information is not intended to replace advice given to you by your health care provider. Make sure you discuss any questions you have with your health care provider. Document Released: 09/24/2005 Document Revised: 03/01/2016 Document Reviewed: 04/08/2015 Elsevier Interactive Patient Education  2018 [place] Maintenance, Female Adopting a healthy lifestyle and getting preventive care can go a long way to promote health and wellness. Talk with your health care provider about what schedule of regular examinations is right for you. This is a good chance for you to check in with your provider about disease prevention and staying healthy. In between checkups, there are plenty of things you can do  on your own. Experts have done a lot of research about which lifestyle changes and preventive measures are most likely to keep you healthy. Ask your health care provider for more information. Weight and diet Eat a healthy diet  Be sure to include plenty of vegetables, fruits, low-fat dairy products, and lean protein.  Do not eat a lot of foods high in solid fats, added sugars, or salt.  Get regular exercise. This is one of  the most important things you can do for your health. ? Most adults should exercise for at least 150 minutes each week. The exercise should increase your heart rate and make you sweat (moderate-intensity exercise). ? Most adults should also do strengthening exercises at least twice a week. This is in addition to the moderate-intensity exercise.  Maintain a healthy weight  Body mass index (BMI) is a measurement that can be used to identify possible weight problems. It estimates body fat based on height and weight. Your health care provider can help determine your BMI and help you achieve or maintain a healthy weight.  For females 66 years of age and older: ? A BMI below 18.5 is considered underweight. ? A BMI of 18.5 to 24.9 is normal. ? A BMI of 25 to 29.9 is considered overweight. ? A BMI of 30 and above is considered obese.  Watch levels of cholesterol and blood lipids  You should start having your blood tested for lipids and cholesterol at 50 years of age, then have this test every 5 years.  You may need to have your cholesterol levels checked more often if: ? Your lipid or cholesterol levels are high. ? You are older than 50 years of age. ? You are at high risk for heart disease.  Cancer screening Lung Cancer  Lung cancer screening is recommended for adults 57-64 years old who are at high risk for lung cancer because of a history of smoking.  A yearly low-dose CT scan of the lungs is recommended for people who: ? Currently smoke. ? Have quit within the past 15 years. ? Have at least a 30-pack-year history of smoking. A pack year is smoking an average of one pack of cigarettes a day for 1 year.  Yearly screening should continue until it has been 15 years since you quit.  Yearly screening should stop if you develop a health problem that would prevent you from having lung cancer treatment.  Breast Cancer  Practice breast self-awareness. This means understanding how your  breasts normally appear and feel.  It also means doing regular breast self-exams. Let your health care provider know about any changes, no matter how small.  If you are in your 20s or 30s, you should have a clinical breast exam (CBE) by a health care provider every 1-3 years as part of a regular health exam.  If you are 69 or older, have a CBE every year. Also consider having a breast X-ray (mammogram) every year.  If you have a family history of breast cancer, talk to your health care provider about genetic screening.  If you are at high risk for breast cancer, talk to your health care provider about having an MRI and a mammogram every year.  Breast cancer gene (BRCA) assessment is recommended for women who have family members with BRCA-related cancers. BRCA-related cancers include: ? Breast. ? Ovarian. ? Tubal. ? Peritoneal cancers.  Results of the assessment will determine the need for genetic counseling and BRCA1 and BRCA2 testing.  Cervical  Cancer Your health care provider may recommend that you be screened regularly for cancer of the pelvic organs (ovaries, uterus, and vagina). This screening involves a pelvic examination, including checking for microscopic changes to the surface of your cervix (Pap test). You may be encouraged to have this screening done every 3 years, beginning at age 86.  For women ages 22-65, health care providers may recommend pelvic exams and Pap testing every 3 years, or they may recommend the Pap and pelvic exam, combined with testing for human papilloma virus (HPV), every 5 years. Some types of HPV increase your risk of cervical cancer. Testing for HPV may also be done on women of any age with unclear Pap test results.  Other health care providers may not recommend any screening for nonpregnant women who are considered low risk for pelvic cancer and who do not have symptoms. Ask your health care provider if a screening pelvic exam is right for you.  If you  have had past treatment for cervical cancer or a condition that could lead to cancer, you need Pap tests and screening for cancer for at least 20 years after your treatment. If Pap tests have been discontinued, your risk factors (such as having a new sexual partner) need to be reassessed to determine if screening should resume. Some women have medical problems that increase the chance of getting cervical cancer. In these cases, your health care provider may recommend more frequent screening and Pap tests.  Colorectal Cancer  This type of cancer can be detected and often prevented.  Routine colorectal cancer screening usually begins at 50 years of age and continues through 50 years of age.  Your health care provider may recommend screening at an earlier age if you have risk factors for colon cancer.  Your health care provider may also recommend using home test kits to check for hidden blood in the stool.  A small camera at the end of a tube can be used to examine your colon directly (sigmoidoscopy or colonoscopy). This is done to check for the earliest forms of colorectal cancer.  Routine screening usually begins at age 32.  Direct examination of the colon should be repeated every 5-10 years through 50 years of age. However, you may need to be screened more often if early forms of precancerous polyps or small growths are found.  Skin Cancer  Check your skin from head to toe regularly.  Tell your health care provider about any new moles or changes in moles, especially if there is a change in a mole's shape or color.  Also tell your health care provider if you have a mole that is larger than the size of a pencil eraser.  Always use sunscreen. Apply sunscreen liberally and repeatedly throughout the day.  Protect yourself by wearing long sleeves, pants, a wide-brimmed hat, and sunglasses whenever you are outside.  Heart disease, diabetes, and high blood pressure  High blood pressure causes  heart disease and increases the risk of stroke. High blood pressure is more likely to develop in: ? People who have blood pressure in the high end of the normal range (130-139/85-89 mm Hg). ? People who are overweight or obese. ? People who are African American.  If you are 80-94 years of age, have your blood pressure checked every 3-5 years. If you are 22 years of age or older, have your blood pressure checked every year. You should have your blood pressure measured twice-once when you are at a hospital or  clinic, and once when you are not at a hospital or clinic. Record the average of the two measurements. To check your blood pressure when you are not at a hospital or clinic, you can use: ? An automated blood pressure machine at a pharmacy. ? A home blood pressure monitor.  If you are between 12 years and 82 years old, ask your health care provider if you should take aspirin to prevent strokes.  Have regular diabetes screenings. This involves taking a blood sample to check your fasting blood sugar level. ? If you are at a normal weight and have a low risk for diabetes, have this test once every three years after 50 years of age. ? If you are overweight and have a high risk for diabetes, consider being tested at a younger age or more often. Preventing infection Hepatitis B  If you have a higher risk for hepatitis B, you should be screened for this virus. You are considered at high risk for hepatitis B if: ? You were born in a country where hepatitis B is common. Ask your health care provider which countries are considered high risk. ? Your parents were born in a high-risk country, and you have not been immunized against hepatitis B (hepatitis B vaccine). ? You have HIV or AIDS. ? You use needles to inject street drugs. ? You live with someone who has hepatitis B. ? You have had sex with someone who has hepatitis B. ? You get hemodialysis treatment. ? You take certain medicines for  conditions, including cancer, organ transplantation, and autoimmune conditions.  Hepatitis C  Blood testing is recommended for: ? Everyone born from [birth year] through 1963. ? Anyone with known risk factors for hepatitis C.  Sexually transmitted infections (STIs)  You should be screened for sexually transmitted infections (STIs) including gonorrhea and chlamydia if: ? You are sexually active and are younger than 50 years of age. ? You are older than 50 years of age and your health care provider tells you that you are at risk for this type of infection. ? Your sexual activity has changed since you were last screened and you are at an increased risk for chlamydia or gonorrhea. Ask your health care provider if you are at risk.  If you do not have HIV, but are at risk, it may be recommended that you take a prescription medicine daily to prevent HIV infection. This is called pre-exposure prophylaxis (PrEP). You are considered at risk if: ? You are sexually active and do not regularly use condoms or know the HIV status of your partner(s). ? You take drugs by injection. ? You are sexually active with a partner who has HIV.  Talk with your health care provider about whether you are at high risk of being infected with HIV. If you choose to begin PrEP, you should first be tested for HIV. You should then be tested every 3 months for as long as you are taking PrEP. Pregnancy  If you are premenopausal and you may become pregnant, ask your health care provider about preconception counseling.  If you may become pregnant, take 400 to 800 micrograms (mcg) of folic acid every day.  If you want to prevent pregnancy, talk to your health care provider about birth control (contraception). Osteoporosis and menopause  Osteoporosis is a disease in which the bones lose minerals and strength with aging. This can result in serious bone fractures. Your risk for osteoporosis can be identified using a bone density  scan.  If you are 110 years of age or older, or if you are at risk for osteoporosis and fractures, ask your health care provider if you should be screened.  Ask your health care provider whether you should take a calcium or vitamin D supplement to lower your risk for osteoporosis.  Menopause may have certain physical symptoms and risks.  Hormone replacement therapy may reduce some of these symptoms and risks. Talk to your health care provider about whether hormone replacement therapy is right for you. Follow these instructions at home:  Schedule regular health, dental, and eye exams.  Stay current with your immunizations.  Do not use any tobacco products including cigarettes, chewing tobacco, or electronic cigarettes.  If you are pregnant, do not drink alcohol.  If you are breastfeeding, limit how much and how often you drink alcohol.  Limit alcohol intake to no more than 1 drink per day for nonpregnant women. One drink equals 12 ounces of beer, 5 ounces of wine, or 1 ounces of hard liquor.  Do not use street drugs.  Do not share needles.  Ask your health care provider for help if you need support or information about quitting drugs.  Tell your health care provider if you often feel depressed.  Tell your health care provider if you have ever been abused or do not feel safe at home. This information is not intended to replace advice given to you by your health care provider. Make sure you discuss any questions you have with your health care provider. Document Released: 04/09/2011 Document Revised: 03/01/2016 Document Reviewed: 06/28/2015 Elsevier Interactive Patient Education  [employer].

## 2018-06-24 NOTE — Progress Notes (Addendum)
Patient ID: Makaela Cando, female    DOB: [birth year]  Age: 52 y.o. MRN: [id number]  The patient is here for annual preventive exam last seen July 2017  s/p TAH/BSO.   The risk factors are reflected in the social history.  The roster of all physicians providing medical care to patient - is listed in the Snapshot section of the chart.  Activities of daily living:  The patient is 100% independent in all ADLs: dressing, toileting, feeding as well as independent mobility  Home safety : The patient has smoke detectors in the home. They wear seatbelts.  There are no firearms at home. There is no violence in the home.   There is no risks for hepatitis, STDs or HIV. There is no   history of blood transfusion. They have no travel history to infectious disease endemic areas of the world.  The patient has seen their dentist in the last six month. They have seen their eye doctor in the last year. They admit to slight hearing difficulty with regard to whispered voices and some television programs.  They have deferred audiologic testing in the last year.  They do not  have excessive sun exposure. Discussed the need for sun protection: hats, long sleeves and use of sunscreen if there is significant sun exposure.   Diet: the importance of a healthy diet is discussed. They do have a healthy diet.  The benefits of regular aerobic exercise were discussed. She exercises 3 times per week  60 minutes .   Depression screen: there are no signs or vegative symptoms of depression- irritability, change in appetite, anhedonia, sadness/tearfullness.  Cognitive assessment: the patient manages all their financial and personal affairs and is actively engaged. They could relate day,date,year and events; recalled 2/3 objects at 3 minutes; performed clock-face test normally.  The following portions of the patient's history were reviewed and updated as appropriate: allergies, current medications, past family history, past  medical history,  past surgical history, past social history  and problem list.  Visual acuity was not assessed per patient preference since she has regular follow up with her ophthalmologist. Hearing and body mass index were assessed and reviewed.   During the course of the visit the patient was educated and counseled about appropriate screening and preventive services including : fall prevention , diabetes screening, nutrition counseling, colorectal cancer screening, and recommended immunizations.    CC: The primary encounter diagnosis was Breast cancer screening. Diagnoses of Eczema of right hand, Colon cancer screening, Visit for preventive health examination, Constipation by delayed colonic transit, Overweight, and Gastroesophageal reflux disease with esophagitis were also pertinent to this visit.  1) GERD:  Not responding  to OTC PPI . Symptoms worse in the afternoon.  2) breast enlargement associated with weight gain.  Used to be DD . Now larger.  Having shoulder pain, bilaterally. Due to breast size .  Wants to have reduction      History Tatianna has a past medical history of Allergy, Arthritis, GERD (gastroesophageal reflux disease), UTI (urinary tract infection), Inflammatory polyps, Migraines, and Spina bifida ([place]).   She has a past surgical history that includes Abdominal hysterectomy (2011 ); Cervix removal (2013); Spinal fusion (1974); grise/green (1972); lowered arches (1975); Great toe arthrodesis, Jones procedure (1979); spinal fusion/eggshell/moll rods/herrington (1984); laparoscopy (2011); and Total ankle replacement (Left, 2015).   Her family history includes Brain cancer in her sister; Breast cancer in her paternal grandmother; Colon polyps in her father; Diabetes in her mother; Gallbladder disease in  her mother; Heart attack in her maternal grandfather; Heart disease in her father and mother; Irritable bowel syndrome in her mother; Pancreatic cancer (age of onset: 13) in her  father; Stomach cancer in her paternal grandmother; Stroke in her maternal grandmother.She reports that she has never smoked. She has never used smokeless tobacco. She reports that she drinks about 3.0 - 4.0 standard drinks of alcohol per week. She reports that she does not use drugs.  Outpatient Medications Prior to Visit  Medication Sig Dispense Refill  . Ca & Phos-Vit D-Mag (CALCIUM) [phone] TABS Take by mouth daily.    . cholecalciferol (VITAMIN D) 1000 units tablet Take 1,000 Units by mouth daily.    . Coenzyme Q10 (COQ10) 100 MG CAPS Take by mouth daily.    . Multiple Vitamins-Minerals (MULTIVITAMIN WITH MINERALS) tablet Take 1 tablet by mouth daily. EHT vitamin replacement    . Naproxen Sodium (ALEVE) 220 MG CAPS Take by mouth as needed. Reported on 11/29/2015    . senna (SENOKOT) 8.6 MG tablet Take 2 tablets by mouth daily.    . lansoprazole (PREVACID) 30 MG capsule Take 1 capsule (30 mg total) by mouth daily. 30 capsule 10  . amoxicillin (AMOXIL) 500 MG capsule Take 4 capsules 2 hours prior to procedure (Patient not taking: Reported on 06/24/2018) 4 capsule 0  . NON FORMULARY Gareinia Cambogia 800 mg -Take 2 a day    . pyridoxine (B-6) 100 MG tablet Take 100 mg by mouth daily.     No facility-administered medications prior to visit.     Review of Systems   Patient denies headache, fevers, malaise, unintentional weight loss, skin rash, eye pain, sinus congestion and sinus pain, sore throat, dysphagia,  hemoptysis , cough, dyspnea, wheezing, chest pain, palpitations, orthopnea, edema, abdominal pain, nausea, melena, diarrhea, constipation, flank pain, dysuria, hematuria, urinary  Frequency, nocturia, numbness, tingling, seizures,  Focal weakness, Loss of consciousness,  Tremor, insomnia, depression, anxiety, and suicidal ideation.      Objective:  BP 122/82 (BP Location: Right Arm, Patient Position: Sitting, Cuff Size: Normal)   Pulse 82   Temp 98.6 F (37 C) (Oral)   Resp 15    Ht 5' (1.524 m)   Wt 141 lb 6.4 oz (64.1 kg)   SpO2 96%   BMI 27.62 kg/m   Physical Exam   General appearance: alert, cooperative and appears stated age Head: Normocephalic, without obvious abnormality, atraumatic Eyes: conjunctivae/corneas clear. PERRL, EOM's intact. Fundi benign. Ears: normal TM's and external ear canals both ears Nose: Nares normal. Septum midline. Mucosa normal. No drainage or sinus tenderness. Throat: lips, mucosa, and tongue normal; teeth and gums normal Neck: no adenopathy, no carotid bruit, no JVD, supple, symmetrical, trachea midline and thyroid not enlarged, symmetric, no tenderness/mass/nodules Lungs: clear to auscultation bilaterally Breasts: normal appearance, no masses or tenderness Heart: regular rate and rhythm, S1, S2 normal, no murmur, click, rub or gallop Abdomen: soft, non-tender; bowel sounds normal; no masses,  no organomegaly Extremities: extremities normal, atraumatic, no cyanosis or edema Pulses: 2+ and symmetric Skin: Skin color, texture, turgor normal. No rashes or lesions Neurologic: Alert and oriented X 3, normal strength and tone. Normal symmetric reflexes. Normal coordination and gait.      Assessment & Plan:   Problem List Items Addressed This Visit    Constipation by delayed colonic transit    Now managed with 3 sennakot daily       Eczema of right hand    Treatment outlined.  GERD (gastroesophageal reflux disease)    Trial of Prevaci 30 mg daily,  since she has failed use of OTC PPIs.  Reviewed behavioral modifications including avoidance of mint, chocolate and alcohol and overeating.  Advised to remain upright for at least 2 hours after eating.  If symptoms are not controlled will add famotidine 20 mg in the afternoon and refer to GI for ph probe and endoscopy.       Relevant Medications   lansoprazole (PREVACID) 30 MG capsule   Overweight    I have addressed  BMI concerns and recommended wt loss of 10% of body  weigh over the next 6 months using a low glycemic index diet and regular exercise a minimum of 5 days per week.        Visit for preventive health examination    Annual comprehensive preventive exam was done as well as an evaluation and management of chronic conditions .  During the course of the visit the patient was educated and counseled about appropriate screening and preventive services including :  diabetes screening, lipid analysis with projected  10 year  risk for CAD , nutrition counseling, breast, cervical and colorectal cancer screening, and recommended immunizations.  Printed recommendations for health maintenance screenings was given       Other Visit Diagnoses    Breast cancer screening    -  Primary   Relevant Orders   MM 3D SCREEN BREAST BILATERAL   Colon cancer screening       Relevant Orders   Ambulatory referral to Gastroenterology      I have discontinued Zoee Mineer's pyridoxine and NON FORMULARY. I am also having her maintain her Naproxen Sodium, multivitamin with minerals, cholecalciferol, Calcium, CoQ10, senna, amoxicillin, and lansoprazole.  Meds ordered this encounter  Medications  . lansoprazole (PREVACID) 30 MG capsule    Sig: Take 1 capsule (30 mg total) by mouth daily.    Dispense:  30 capsule    Refill:  10    Medications Discontinued During This Encounter  Medication Reason  . NON FORMULARY Patient Preference  . pyridoxine (B-6) 100 MG tablet Patient Preference  . lansoprazole (PREVACID) 30 MG capsule Reorder    Follow-up: Return in about 1 year (around 06/25/2019).   Crecencio Mc, MD

## 2018-06-25 ENCOUNTER — Telehealth: Payer: Self-pay | Admitting: Internal Medicine

## 2018-06-25 DIAGNOSIS — K219 Gastro-esophageal reflux disease without esophagitis: Secondary | ICD-10-CM | POA: Insufficient documentation

## 2018-06-25 NOTE — Assessment & Plan Note (Signed)
I have addressed  BMI concerns and recommended wt loss of 10% of body weigh over the next 6 months using a low glycemic index diet and regular exercise a minimum of 5 days per week.

## 2018-06-25 NOTE — Assessment & Plan Note (Signed)
Treatment outlined.

## 2018-06-25 NOTE — Assessment & Plan Note (Addendum)
Trial of Prevaci 30 mg daily,  since she has failed use of OTC PPIs.  Reviewed behavioral modifications including avoidance of mint, chocolate and alcohol and overeating.  Advised to remain upright for at least 2 hours after eating.  If symptoms are not controlled will add famotidine 20 mg in the afternoon and refer to GI for ph probe and endoscopy.

## 2018-06-25 NOTE — Assessment & Plan Note (Signed)
Now managed with 3 sennakot daily

## 2018-06-25 NOTE — Assessment & Plan Note (Signed)
Annual comprehensive preventive exam was done as well as an evaluation and management of chronic conditions .  During the course of the visit the patient was educated and counseled about appropriate screening and preventive services including :  diabetes screening, lipid analysis with projected  10 year  risk for CAD , nutrition counseling, breast, cervical and colorectal cancer screening, and recommended immunizations.  Printed recommendations for health maintenance screenings was given 

## 2018-06-27 ENCOUNTER — Other Ambulatory Visit: Payer: Self-pay

## 2018-06-27 DIAGNOSIS — Z1211 Encounter for screening for malignant neoplasm of colon: Secondary | ICD-10-CM

## 2018-06-30 ENCOUNTER — Other Ambulatory Visit: Payer: Self-pay

## 2018-07-14 ENCOUNTER — Encounter: Payer: Self-pay | Admitting: Student

## 2018-07-15 ENCOUNTER — Encounter
Admission: RE | Disposition: A | Discharge status: Home / Self Care | Payer: Self-pay | Source: Ambulatory Visit | Attending: Gastroenterology

## 2018-07-15 ENCOUNTER — Ambulatory Visit: Payer: Managed Care, Other (non HMO) | Admitting: Certified Registered"

## 2018-07-15 ENCOUNTER — Ambulatory Visit
Admission: RE | Admit: 2018-07-15 | Discharge: 2018-07-15 | Disposition: A | Discharge status: Home / Self Care | Payer: Managed Care, Other (non HMO) | Source: Ambulatory Visit | Attending: Gastroenterology | Admitting: Gastroenterology

## 2018-07-15 ENCOUNTER — Encounter: Payer: Self-pay | Admitting: Anesthesiology

## 2018-07-15 DIAGNOSIS — Z8379 Family history of other diseases of the digestive system: Secondary | ICD-10-CM | POA: Insufficient documentation

## 2018-07-15 DIAGNOSIS — K219 Gastro-esophageal reflux disease without esophagitis: Secondary | ICD-10-CM | POA: Diagnosis not present

## 2018-07-15 DIAGNOSIS — D122 Benign neoplasm of ascending colon: Secondary | ICD-10-CM | POA: Diagnosis not present

## 2018-07-15 DIAGNOSIS — Z888 Allergy status to other drugs, medicaments and biological substances status: Secondary | ICD-10-CM | POA: Insufficient documentation

## 2018-07-15 DIAGNOSIS — Z8371 Family history of colonic polyps: Secondary | ICD-10-CM | POA: Insufficient documentation

## 2018-07-15 DIAGNOSIS — Z90710 Acquired absence of both cervix and uterus: Secondary | ICD-10-CM | POA: Insufficient documentation

## 2018-07-15 DIAGNOSIS — Z87440 Personal history of urinary (tract) infections: Secondary | ICD-10-CM | POA: Diagnosis not present

## 2018-07-15 DIAGNOSIS — G43909 Migraine, unspecified, not intractable, without status migrainosus: Secondary | ICD-10-CM | POA: Diagnosis not present

## 2018-07-15 DIAGNOSIS — Z808 Family history of malignant neoplasm of other organs or systems: Secondary | ICD-10-CM | POA: Insufficient documentation

## 2018-07-15 DIAGNOSIS — Q059 Spina bifida, unspecified: Secondary | ICD-10-CM | POA: Diagnosis not present

## 2018-07-15 DIAGNOSIS — Z79899 Other long term (current) drug therapy: Secondary | ICD-10-CM | POA: Diagnosis not present

## 2018-07-15 DIAGNOSIS — Z803 Family history of malignant neoplasm of breast: Secondary | ICD-10-CM | POA: Diagnosis not present

## 2018-07-15 DIAGNOSIS — M1990 Unspecified osteoarthritis, unspecified site: Secondary | ICD-10-CM | POA: Diagnosis not present

## 2018-07-15 DIAGNOSIS — Z833 Family history of diabetes mellitus: Secondary | ICD-10-CM | POA: Diagnosis not present

## 2018-07-15 DIAGNOSIS — D123 Benign neoplasm of transverse colon: Secondary | ICD-10-CM

## 2018-07-15 DIAGNOSIS — Z981 Arthrodesis status: Secondary | ICD-10-CM | POA: Diagnosis not present

## 2018-07-15 DIAGNOSIS — Z8249 Family history of ischemic heart disease and other diseases of the circulatory system: Secondary | ICD-10-CM | POA: Insufficient documentation

## 2018-07-15 DIAGNOSIS — Z96662 Presence of left artificial ankle joint: Secondary | ICD-10-CM | POA: Diagnosis not present

## 2018-07-15 DIAGNOSIS — Z1211 Encounter for screening for malignant neoplasm of colon: Secondary | ICD-10-CM | POA: Diagnosis not present

## 2018-07-15 DIAGNOSIS — Z800 Family history of malignant neoplasm of digestive organs: Secondary | ICD-10-CM | POA: Diagnosis not present

## 2018-07-15 DIAGNOSIS — K641 Second degree hemorrhoids: Secondary | ICD-10-CM | POA: Diagnosis not present

## 2018-07-15 HISTORY — PX: COLONOSCOPY WITH PROPOFOL: SHX5780

## 2018-07-15 SURGERY — COLONOSCOPY WITH PROPOFOL
Anesthesia: General

## 2018-07-15 MED ORDER — PROPOFOL 10 MG/ML IV BOLUS
INTRAVENOUS | Status: DC | PRN
  Administered 2018-07-15: 50 mg via INTRAVENOUS
  Administered 2018-07-15: 100 mg via INTRAVENOUS

## 2018-07-15 MED ORDER — SODIUM CHLORIDE 0.9 % IV SOLN
INTRAVENOUS | Status: DC
  Administered 2018-07-15: 1000 mL via INTRAVENOUS

## 2018-07-15 MED ORDER — PROPOFOL 500 MG/50ML IV EMUL
INTRAVENOUS | Status: DC | PRN
  Administered 2018-07-15: 125 ug/kg/min via INTRAVENOUS

## 2018-07-15 MED ORDER — PROPOFOL 10 MG/ML IV BOLUS
INTRAVENOUS | Status: AC
  Filled 2018-07-15: qty 40

## 2018-07-15 MED ORDER — LIDOCAINE HCL (CARDIAC) PF 100 MG/5ML IV SOSY
PREFILLED_SYRINGE | INTRAVENOUS | Status: DC | PRN
  Administered 2018-07-15: 80 mg via INTRAVENOUS

## 2018-07-15 NOTE — Transfer of Care (Signed)
Immediate Anesthesia Transfer of Care Note  Patient: Leah Pearson  Procedure(s) Performed: COLONOSCOPY WITH PROPOFOL (N/A )  Patient Location: PACU  Anesthesia Type:General  Level of Consciousness: awake, alert  and oriented  Airway & Oxygen Therapy: Patient Spontanous Breathing and Patient connected to nasal cannula oxygen  Post-op Assessment: Report given to RN and Post -op Vital signs reviewed and stable  Post vital signs: Reviewed and stable  Last Vitals:  Vitals Value Taken Time  BP    Temp    Pulse    Resp    SpO2      Last Pain:  Vitals:   07/15/18 1001  TempSrc: Tympanic  PainSc: 0-No pain         Complications: No apparent anesthesia complications

## 2018-07-15 NOTE — Anesthesia Postprocedure Evaluation (Signed)
Anesthesia Post Note  Patient: Leah Pearson  Procedure(s) Performed: COLONOSCOPY WITH PROPOFOL (N/A )  Patient location during evaluation: Endoscopy Anesthesia Type: General Level of consciousness: awake and alert Pain management: pain level controlled Vital Signs Assessment: post-procedure vital signs reviewed and stable Respiratory status: spontaneous breathing, nonlabored ventilation, respiratory function stable and patient connected to nasal cannula oxygen Cardiovascular status: blood pressure returned to baseline and stable Postop Assessment: no apparent nausea or vomiting Anesthetic complications: no     Last Vitals:  Vitals:   07/15/18 1056 07/15/18 1106  BP: 116/85 112/74  Pulse: 72 75  Resp: 15 20  Temp:    SpO2: 100% 100%    Last Pain:  Vitals:   07/15/18 1106  TempSrc:   PainSc: 0-No pain                 Precious Haws [name]

## 2018-07-15 NOTE — Anesthesia Preprocedure Evaluation (Signed)
Anesthesia Evaluation  Patient identified by MRN, date of birth, ID band Patient awake    Reviewed: Allergy & Precautions, H&P , NPO status , Patient's Chart, lab work & pertinent test results  History of Anesthesia Complications Negative for: history of anesthetic complications  Airway Mallampati: II  TM Distance: >3 FB Neck ROM: full    Dental  (+) Chipped, Implants   Pulmonary neg pulmonary ROS, neg shortness of breath,           Cardiovascular Exercise Tolerance: Good (-) angina(-) Past MI and (-) DOE negative cardio ROS       Neuro/Psych  Headaches, negative psych ROS   GI/Hepatic Neg liver ROS, GERD  Medicated and Controlled,  Endo/Other  negative endocrine ROS  Renal/GU negative Renal ROS  negative genitourinary   Musculoskeletal  (+) Arthritis ,   Abdominal   Peds  Hematology negative hematology ROS (+)   Anesthesia Other Findings Past Medical History: No date: Allergy No date: Arthritis No date: GERD (gastroesophageal reflux disease)     Comment:  with esophagitis No date: Hx: UTI (urinary tract infection) No date: Inflammatory polyps     Comment:  located in gall bladder No date: Migraines No date: Spina bifida [facility])  Past Surgical History: 2011 : ABDOMINAL HYSTERECTOMY 2013: CERVIX REMOVAL 1979: GREAT TOE ARTHRODESIS, JONES PROCEDURE 1972: grise/green 2011: LAPAROSCOPY     Comment:  with hysterectomy 1975: lowered arches 1974: [place]: spinal fusion/eggshell/moll rods/herrington 2015: TOTAL ANKLE REPLACEMENT; Left  BMI    Body Mass Index:  26.95 kg/m      Reproductive/Obstetrics negative OB ROS                             Anesthesia Physical Anesthesia Plan  ASA: II  Anesthesia Plan: General   Post-op Pain Management:    Induction: Intravenous  PONV Risk Score and Plan: Propofol infusion and TIVA  Airway Management Planned: Natural  Airway and Nasal Cannula  Additional Equipment:   Intra-op Plan:   Post-operative Plan:   Informed Consent: I have reviewed the patients History and Physical, chart, labs and discussed the procedure including the risks, benefits and alternatives for the proposed anesthesia with the patient or authorized representative who has indicated his/her understanding and acceptance.   Dental Advisory Given  Plan Discussed with: Anesthesiologist, CRNA and Surgeon  Anesthesia Plan Comments: (Patient consented for risks of anesthesia including but not limited to:  - adverse reactions to medications - risk of intubation if required - damage to teeth, lips or other oral mucosa - sore throat or hoarseness - Damage to heart, brain, lungs or loss of life  Patient voiced understanding.)        Anesthesia Quick Evaluation

## 2018-07-15 NOTE — H&P (Signed)
Leah Lame, MD [address] [place], [place] [address] Phone: [phone] Fax : [phone]  Primary Care Physician:  Crecencio Mc, MD Primary Gastroenterologist:  Dr. Allen Norris  Pre-Procedure History & Physical: HPI:  Leah Pearson is a 52 y.o. female is here for a screening colonoscopy.   Past Medical History:  Diagnosis Date  . Allergy   . Arthritis   . GERD (gastroesophageal reflux disease)    with esophagitis  . Hx: UTI (urinary tract infection)   . Inflammatory polyps    located in gall bladder  . Migraines   . Spina bifida [facility])     Past Surgical History:  Procedure Laterality Date  . ABDOMINAL HYSTERECTOMY  2011   . CERVIX REMOVAL  2013  . GREAT TOE ARTHRODESIS, [place]  . grise/green  1972  . LAPAROSCOPY  2011   with hysterectomy  . lowered arches  1975  . SPINAL FUSION  1974  . spinal fusion/eggshell/moll rods/herrington  1984  . TOTAL ANKLE REPLACEMENT Left 2015    Prior to Admission medications   Medication Sig Start Date End Date Taking? Authorizing Provider  Ca & Phos-Vit D-Mag (CALCIUM) [phone] TABS Take by mouth daily.   Yes [provider]  cholecalciferol (VITAMIN D) 1000 units tablet Take 1,000 Units by mouth daily.   Yes [provider]  Coenzyme Q10 (COQ10) 100 MG CAPS Take by mouth daily.   Yes [provider]  lansoprazole (PREVACID) 30 MG capsule Take 1 capsule (30 mg total) by mouth daily. 06/24/18  Yes Crecencio Mc, MD  Naproxen Sodium (ALEVE) 220 MG CAPS Take by mouth as needed. Reported on 11/29/2015   Yes [provider]  senna (SENOKOT) 8.6 MG tablet Take 2 tablets by mouth daily.   Yes [provider]  amoxicillin (AMOXIL) 500 MG capsule Take 4 capsules 2 hours prior to procedure Patient not taking: Reported on 06/24/2018 04/11/16   Crecencio Mc, MD  Multiple Vitamins-Minerals (MULTIVITAMIN WITH MINERALS) tablet Take 1 tablet by mouth daily. EHT vitamin  replacement    [provider]    Allergies as of 06/27/2018 - Review Complete 06/24/2018  Allergen Reaction Noted  . Ferra-caps [iron] Rash 10/15/2012    Family History  Problem Relation Age of Onset  . Diabetes Mother   . Heart disease Mother        valvular dz no replacement  . Irritable bowel syndrome Mother   . Gallbladder disease Mother   . Heart disease Father   . Pancreatic cancer Father 74  . Colon polyps Father   . Stroke Maternal Grandmother   . Heart attack Maternal Grandfather   . Stomach cancer Paternal Grandmother   . Breast cancer Paternal Grandmother   . Brain cancer Sister   . Colon cancer Neg Hx   . Esophageal cancer Neg Hx   . Rectal cancer Neg Hx     Social History   Socioeconomic History  . Marital status: Married    Spouse name: Not on file  . Number of children: 0  . Years of education: Not on file  . Highest education level: Not on file  Occupational History  . Occupation: Chiropractor  Social Needs  . Financial resource strain: Not on file  . Food insecurity:    Worry: Not on file    Inability: Not on file  . Transportation needs:    Medical: Not on file    Non-medical: Not on file  Tobacco Use  . Smoking status: Never Smoker  . Smokeless tobacco: Never Used  Substance and Sexual Activity  . Alcohol use: Yes    Alcohol/week: 3.0 - 4.0 standard drinks    Types: 3 - 4 Glasses of wine per week  . Drug use: No  . Sexual activity: Not on file  Lifestyle  . Physical activity:    Days per week: Not on file    Minutes per session: Not on file  . Stress: Not on file  Relationships  . Social connections:    Talks on phone: Not on file    Gets together: Not on file    Attends religious service: Not on file    Active member of club or organization: Not on file    Attends meetings of clubs or organizations: Not on file    Relationship status: Not on file  . Intimate partner violence:    Fear of current or ex partner: Not on  file    Emotionally abused: Not on file    Physically abused: Not on file    Forced sexual activity: Not on file  Other Topics Concern  . Not on file  Social History Narrative  . Not on file    Review of Systems: See HPI, otherwise negative ROS  Physical Exam: BP 126/87   Pulse 78   Temp (!) 97.1 F (36.2 C) (Tympanic)   Resp 20   Ht 5' (1.524 m)   Wt 62.6 kg   SpO2 100%   BMI 26.95 kg/m  General:   Alert,  pleasant and cooperative in NAD Head:  Normocephalic and atraumatic. Neck:  Supple; no masses or thyromegaly. Lungs:  Clear throughout to auscultation.    Heart:  Regular rate and rhythm. Abdomen:  Soft, nontender and nondistended. Normal bowel sounds, without guarding, and without rebound.   Neurologic:  Alert and  oriented x4;  grossly normal neurologically.  Impression/Plan: Leah Pearson is now here to undergo a screening colonoscopy.  Risks, benefits, and alternatives regarding colonoscopy have been reviewed with the patient.  Questions have been answered.  All parties agreeable.

## 2018-07-15 NOTE — Anesthesia Post-op Follow-up Note (Signed)
Anesthesia QCDR form completed.        

## 2018-07-15 NOTE — Op Note (Signed)
[facility] Patient Name: Leah Pearson Procedure Date: 07/15/2018 10:11 AM MRN: [id number] Account #: [id number] Date of Birth: [birth year] Admit Type: Outpatient Age: 52 Room: [facility] ENDO ROOM 4 Gender: Female Note Status: Finalized Procedure:            Colonoscopy Indications:          Screening for colorectal malignant neoplasm Providers:            Lucilla Lame MD, MD Referring MD:         Deborra Medina, MD (Referring MD) Medicines:            Propofol per Anesthesia Complications:        No immediate complications. Procedure:            Pre-Anesthesia Assessment:                       - Prior to the procedure, a History and Physical was                        performed, and patient medications and allergies were                        reviewed. The patient's tolerance of previous                        anesthesia was also reviewed. The risks and benefits of                        the procedure and the sedation options and risks were                        discussed with the patient. All questions were                        answered, and informed consent was obtained. Prior                        Anticoagulants: The patient has taken no previous                        anticoagulant or antiplatelet agents. ASA Grade                        Assessment: II - A patient with mild systemic disease.                        After reviewing the risks and benefits, the patient was                        deemed in satisfactory condition to undergo the                        procedure.                       After obtaining informed consent, the colonoscope was                        passed under direct vision. Throughout the procedure,  the patient's blood pressure, pulse, and oxygen                        saturations were monitored continuously. The                        Colonoscope was introduced through the anus and         advanced to the the cecum, identified by appendiceal                        orifice and ileocecal valve. The colonoscopy was                        performed without difficulty. The patient tolerated the                        procedure well. The quality of the bowel preparation                        was excellent. Findings:      The perianal and digital rectal examinations were normal.      A 6 mm polyp was found in the ascending colon. The polyp was sessile.       The polyp was removed with a cold snare. Resection and retrieval were       complete.      A 4 mm polyp was found in the transverse colon. The polyp was sessile.       The polyp was removed with a cold snare. Resection and retrieval were       complete.      Non-bleeding internal hemorrhoids were found during retroflexion. The       hemorrhoids were Grade II (internal hemorrhoids that prolapse but reduce       spontaneously). Impression:           - One 6 mm polyp in the ascending colon, removed with a                        cold snare. Resected and retrieved.                       - One 4 mm polyp in the transverse colon, removed with                        a cold snare. Resected and retrieved.                       - Non-bleeding internal hemorrhoids. Recommendation:       - Discharge patient to home.                       - Resume previous diet.                       - Continue present medications.                       - Await pathology results.                       - Repeat colonoscopy in 5 years if polyp adenoma and 10  years if hyperplastic Procedure Code(s):    --- Professional ---                       [phone], Colonoscopy, flexible; with removal of tumor(s),                        polyp(s), or other lesion(s) by snare technique Diagnosis Code(s):    --- Professional ---                       Z12.11, Encounter for screening for malignant neoplasm                        of colon                        D12.2, Benign neoplasm of ascending colon                       D12.3, Benign neoplasm of transverse colon (hepatic                        flexure or splenic flexure) CPT copyright 2018 American Medical Association. All rights reserved. The codes documented in this report are preliminary and upon coder review may  be revised to meet current compliance requirements. Lucilla Lame MD, MD 07/15/2018 10:32:03 AM This report has been signed electronically. Number of Addenda: 0 Note Initiated On: 07/15/2018 10:11 AM Scope Withdrawal Time: 0 hours 7 minutes 5 seconds  Total Procedure Duration: 0 hours 12 minutes 14 seconds       [facility]

## 2018-07-16 ENCOUNTER — Encounter: Payer: Self-pay | Admitting: Gastroenterology

## 2018-07-16 LAB — SURGICAL PATHOLOGY

## 2018-07-20 ENCOUNTER — Encounter: Payer: Self-pay | Admitting: Gastroenterology

## 2018-08-07 MED ORDER — FLUCONAZOLE 150 MG PO TABS: 150.0000 mg | ORAL_TABLET | Freq: Every day | ORAL | 0 refills | Status: DC

## 2018-08-07 MED ORDER — AMOXICILLIN 500 MG PO CAPS: ORAL_CAPSULE | ORAL | 2 refills | Status: DC

## 2018-08-12 ENCOUNTER — Ambulatory Visit
Admission: RE | Admit: 2018-08-12 | Discharge: 2018-08-12 | Disposition: A | Discharge status: Home / Self Care | Payer: Managed Care, Other (non HMO) | Source: Ambulatory Visit | Attending: Internal Medicine | Admitting: Internal Medicine

## 2018-08-12 DIAGNOSIS — Z1239 Encounter for other screening for malignant neoplasm of breast: Secondary | ICD-10-CM | POA: Diagnosis present

## 2018-08-13 ENCOUNTER — Other Ambulatory Visit: Payer: Self-pay | Admitting: Internal Medicine

## 2018-08-13 DIAGNOSIS — R928 Other abnormal and inconclusive findings on diagnostic imaging of breast: Secondary | ICD-10-CM

## 2018-08-13 DIAGNOSIS — R921 Mammographic calcification found on diagnostic imaging of breast: Secondary | ICD-10-CM

## 2018-08-18 NOTE — Telephone Encounter (Signed)
Message forwarded from PCP from patient regarding gallbladder polyps. She was seen by me in 2017, but recently by Dr. Allen Norris for screening colonoscopy.  She has small gallbladder polyps in 2016, repeat US gallbladder is reasonable to exclude change in size of known gallbladder polyps.  If growing significantly then cholecystectomy would be considered This can be ordered.  Defer to Dr. Allen Norris regarding colon surveillance interval, but 5 years is appropriate for next colonoscopy

## 2018-08-19 ENCOUNTER — Other Ambulatory Visit: Payer: Self-pay | Admitting: Internal Medicine

## 2018-08-19 DIAGNOSIS — K824 Cholesterolosis of gallbladder: Secondary | ICD-10-CM

## 2018-08-19 NOTE — Progress Notes (Signed)
Thanks for the advice!  TT

## 2018-08-19 NOTE — Telephone Encounter (Signed)
Message forwarded to Dr Allen Norris, patient's current GI physician.

## 2018-08-28 ENCOUNTER — Ambulatory Visit
Admission: RE | Admit: 2018-08-28 | Discharge: 2018-08-28 | Disposition: A | Discharge status: Home / Self Care | Source: Ambulatory Visit | Attending: Internal Medicine | Admitting: Internal Medicine

## 2018-08-28 DIAGNOSIS — R921 Mammographic calcification found on diagnostic imaging of breast: Secondary | ICD-10-CM | POA: Diagnosis present

## 2018-08-28 DIAGNOSIS — R928 Other abnormal and inconclusive findings on diagnostic imaging of breast: Secondary | ICD-10-CM | POA: Insufficient documentation

## 2018-09-08 ENCOUNTER — Ambulatory Visit
Admission: RE | Admit: 2018-09-08 | Discharge: 2018-09-08 | Disposition: A | Discharge status: Home / Self Care | Payer: Managed Care, Other (non HMO) | Source: Ambulatory Visit | Attending: Internal Medicine | Admitting: Internal Medicine

## 2018-09-08 DIAGNOSIS — K824 Cholesterolosis of gallbladder: Secondary | ICD-10-CM | POA: Diagnosis present

## 2019-03-20 IMAGING — MG DIGITAL SCREENING BILATERAL MAMMOGRAM WITH TOMO AND CAD
6 of 9 series · 6 of 25 positions shown · non-contrast
Comparison: Previous exam(s).

CLINICAL DATA: Screening.

EXAM:
DIGITAL SCREENING BILATERAL MAMMOGRAM WITH TOMO AND CAD

[L CC synth-2D]
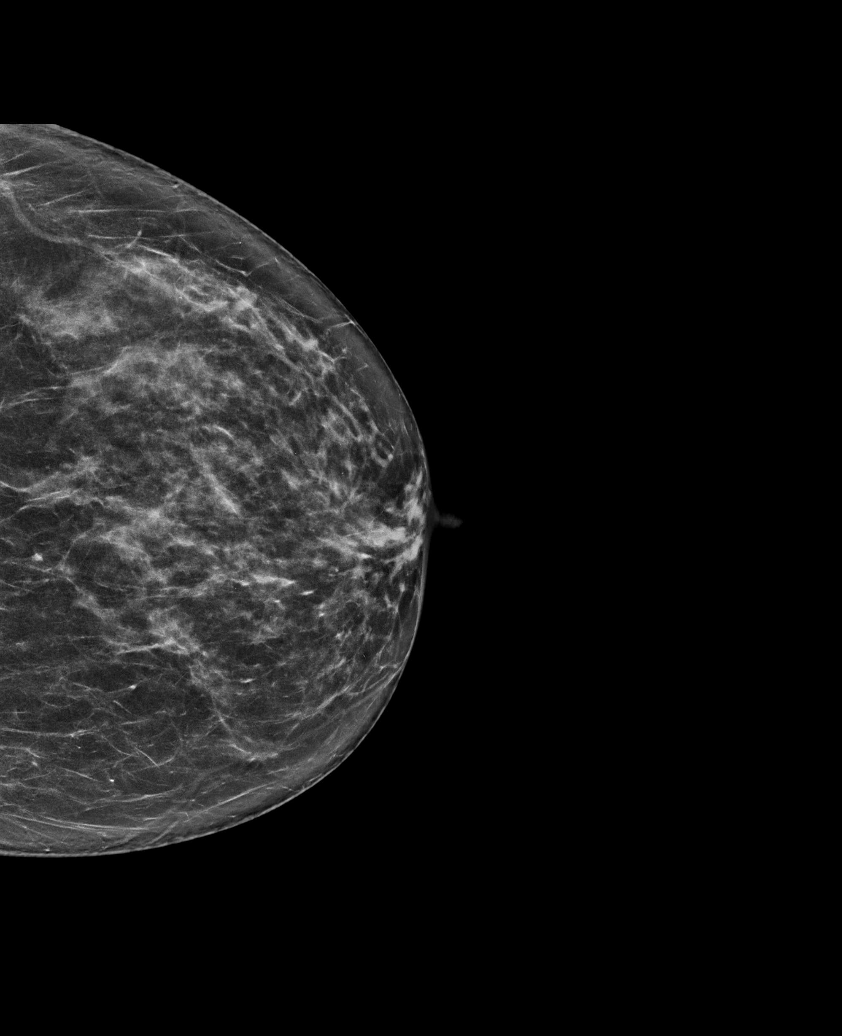

[L MLO synth-2D]
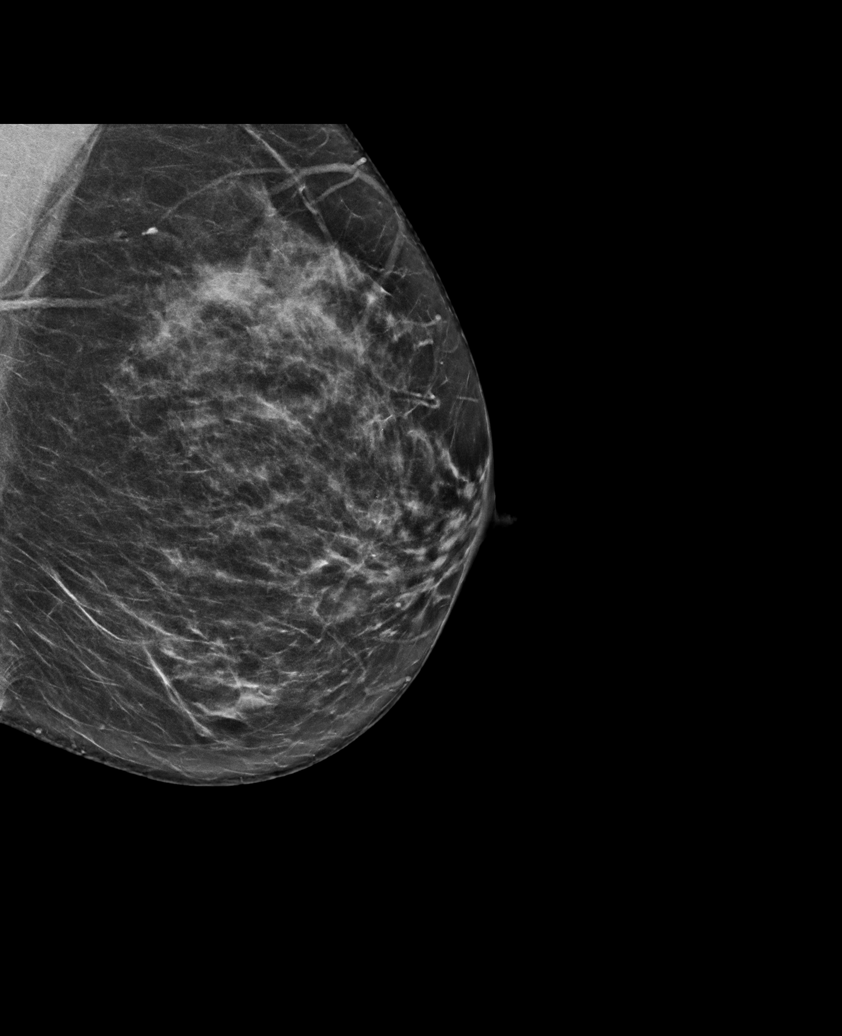

[R MLO synth-2D]
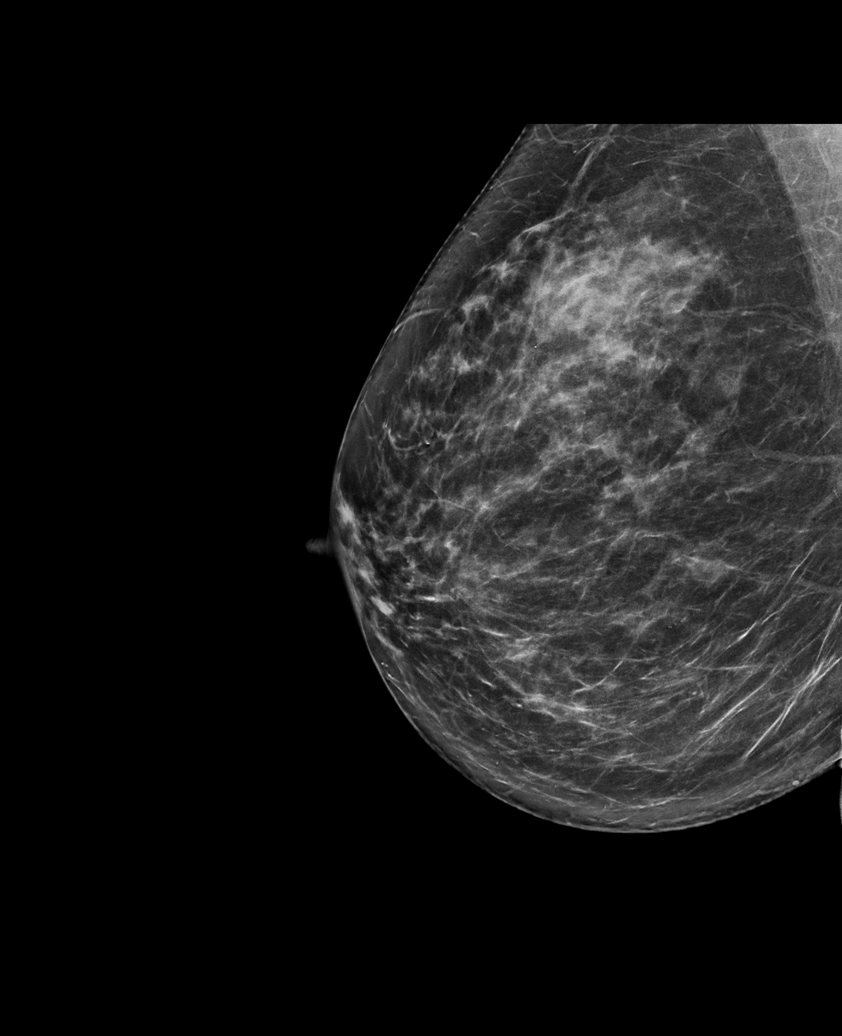

[R CC synth-2D]
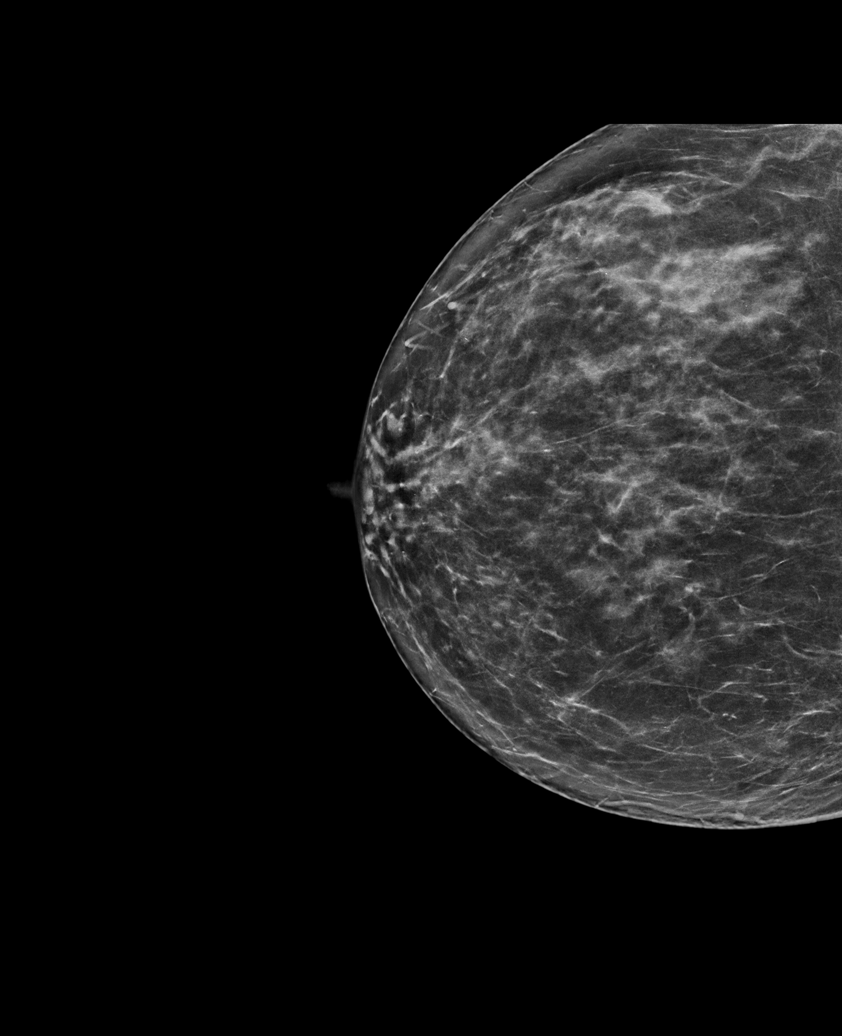

[R CC tomo · tomo slice 40/79.0]
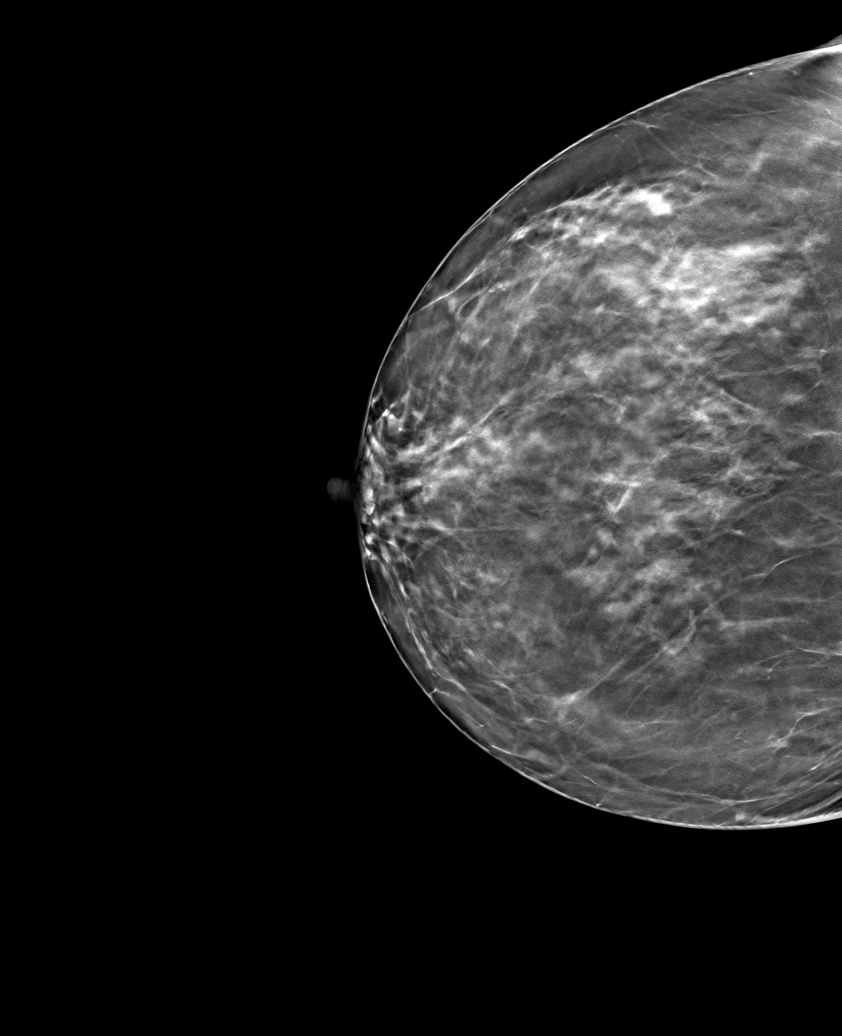

[R MLO]
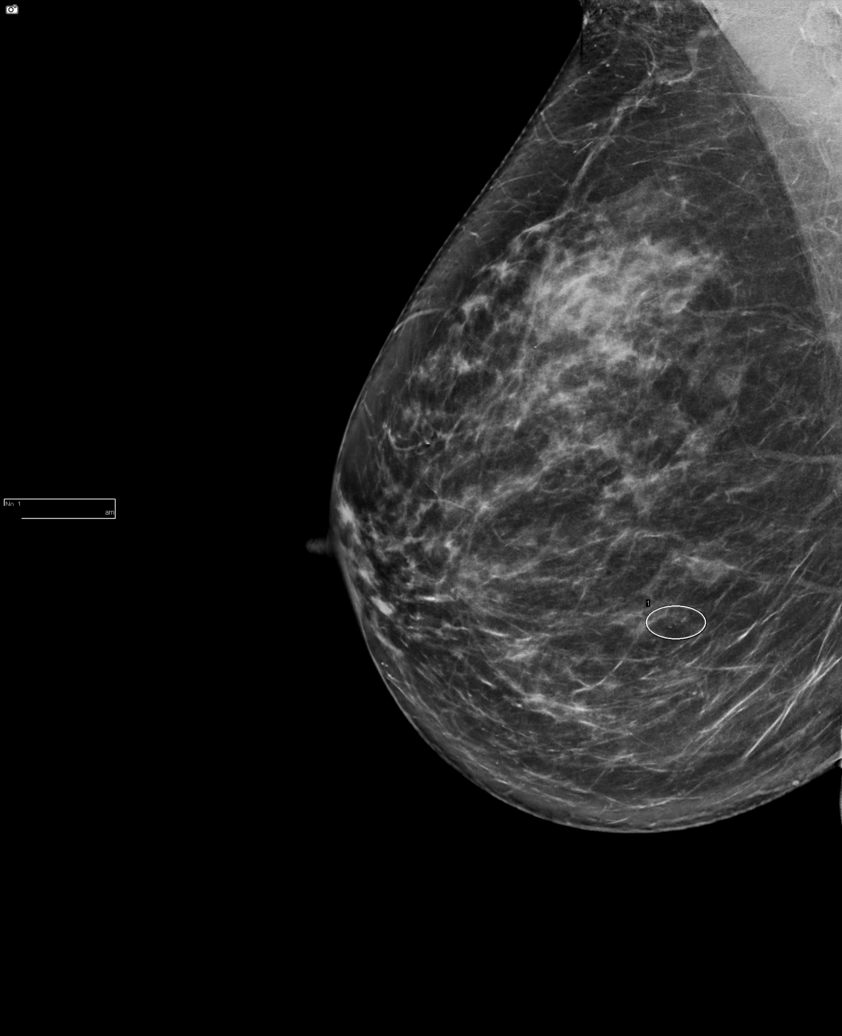

[6 of 25 positions shown; findings below may reference images not displayed]

ACR Breast Density Category c: The breast tissue is heterogeneously
dense, which may obscure small masses.
FINDINGS: In the right breast, calcifications warrant further evaluation with
magnified views. These calcifications are seen within the inferior
RIGHT breast, middle to posterior depth, seen best on MLO view.

In the left breast, no findings suspicious for malignancy. Images
were processed with CAD.
IMPRESSION: Further evaluation is suggested for calcifications in the right
breast.

RECOMMENDATION:
Diagnostic mammogram of the right breast. (Code:[id number])

The patient will be contacted regarding the findings, and additional
imaging will be scheduled.

BI-RADS CATEGORY  0: Incomplete. Need additional imaging evaluation
and/or prior mammograms for comparison.

## 2019-06-23 ENCOUNTER — Other Ambulatory Visit: Payer: Self-pay

## 2019-06-25 ENCOUNTER — Ambulatory Visit (INDEPENDENT_AMBULATORY_CARE_PROVIDER_SITE_OTHER): Admitting: Internal Medicine

## 2019-06-25 ENCOUNTER — Other Ambulatory Visit: Payer: Self-pay

## 2019-06-25 ENCOUNTER — Telehealth: Payer: Self-pay

## 2019-06-25 ENCOUNTER — Encounter: Payer: Self-pay | Admitting: Internal Medicine

## 2019-06-25 VITALS — BP 118/72 | HR 82 | Temp 97.1°F | Resp 14 | Ht 60.0 in | Wt 145.8 lb

## 2019-06-25 DIAGNOSIS — Z0000 Encounter for general adult medical examination without abnormal findings: Secondary | ICD-10-CM | POA: Diagnosis not present

## 2019-06-25 DIAGNOSIS — E785 Hyperlipidemia, unspecified: Secondary | ICD-10-CM

## 2019-06-25 DIAGNOSIS — J040 Acute laryngitis: Secondary | ICD-10-CM | POA: Diagnosis not present

## 2019-06-25 DIAGNOSIS — Z1239 Encounter for other screening for malignant neoplasm of breast: Secondary | ICD-10-CM | POA: Diagnosis not present

## 2019-06-25 DIAGNOSIS — Z79899 Other long term (current) drug therapy: Secondary | ICD-10-CM | POA: Diagnosis not present

## 2019-06-25 DIAGNOSIS — U071 COVID-19: Secondary | ICD-10-CM | POA: Diagnosis not present

## 2019-06-25 DIAGNOSIS — E559 Vitamin D deficiency, unspecified: Secondary | ICD-10-CM

## 2019-06-25 DIAGNOSIS — K219 Gastro-esophageal reflux disease without esophagitis: Secondary | ICD-10-CM

## 2019-06-25 DIAGNOSIS — N952 Postmenopausal atrophic vaginitis: Secondary | ICD-10-CM

## 2019-06-25 MED ORDER — ESTROGENS, CONJUGATED 0.625 MG/GM VA CREA: TOPICAL_CREAM | VAGINAL | 12 refills | Status: DC

## 2019-06-25 MED ORDER — LANSOPRAZOLE 30 MG PO CPDR: 30.0000 mg | DELAYED_RELEASE_CAPSULE | Freq: Every day | ORAL | 3 refills | Status: DC

## 2019-06-25 NOTE — Telephone Encounter (Signed)
Badge was placed up front earlier today for pt to pick up.

## 2019-06-25 NOTE — Progress Notes (Signed)
Patient ID: Leah Pearson, female    DOB: [birth year]  Age: 53 y.o. MRN: [id number]  The patient is here for annual preventive  examination and management of other chronic and acute problems.  Mammogram ordered today  Colonoscopy  Due in 2024  S.p hysterectomy    The risk factors are reflected in the social history.  The roster of all physicians providing medical care to patient - is listed in the Snapshot section of the chart.  Activities of daily living:  The patient is 100% independent in all ADLs: dressing, toileting, feeding as well as independent mobility  Home safety : The patient has smoke detectors in the home. They wear seatbelts.  There are no unsecured firearms at home. There is no violence in the home.   There is no risks for hepatitis, STDs or HIV. There is no   history of blood transfusion. They have no travel history to infectious disease endemic areas of the world.  The patient has seen their dentist in the last six month. They have seen their eye doctor in the last year. They admit to slight hearing difficulty with regard to whispered voices and some television programs.  They have deferred audiologic testing in the last year.  They do not  have excessive sun exposure. Discussed the need for sun protection: hats, long sleeves and use of sunscreen if there is significant sun exposure.   Diet: the importance of a healthy diet is discussed. They do have a healthy diet.  The benefits of regular aerobic exercise were discussed. She walks 4 times per week ,  60 minutes.   Depression screen: there are no signs or vegative symptoms of depression- irritability, change in appetite, anhedonia, sadness/tearfullness.  The following portions of the patient's history were reviewed and updated as appropriate: allergies, current medications, past family history, past medical history,  past surgical history, past social history  and problem list.  Visual acuity was not assessed per patient  preference since she has regular follow up with her ophthalmologist. Hearing and body mass index were assessed and reviewed.   During the course of the visit the patient was educated and counseled about appropriate screening and preventive services including : fall prevention , diabetes screening, nutrition counseling, colorectal cancer screening, and recommended immunizations.  CC: The primary encounter diagnosis was Breast cancer screening. Diagnoses of COVID-19 virus infection, Hyperlipidemia LDL goal <160, Encounter for preventive health examination, Long-term use of high-risk medication, Reflux laryngitis, Visit for preventive health examination, Vitamin D deficiency, and Atrophic vaginitis were also pertinent to this visit.  She feels generally well and has no complaints.  Had a prolonged lower respiratory illness in January ,  Lasted several weeks . The sickest she has ever been .  Wants to be tested for past infection of COVID 19 . Explained the relative inaccuracy of the test and the prognostic implications (including the lack of confidence in the results).   2) vaginal dryness  Has tried otc lubricants without much improvement. Denies bleeding,  Dysuria,  Disharge.   History Emili has a past medical history of Allergy, Arthritis, GERD (gastroesophageal reflux disease), UTI (urinary tract infection), Inflammatory polyps, Migraines, and Spina bifida ([place]).   She has a past surgical history that includes Abdominal hysterectomy (2011 ); Cervix removal (2013); Spinal fusion (1974); grise/green (1972); lowered arches (1975); Great toe arthrodesis, Jones procedure (1979); spinal fusion/eggshell/moll rods/herrington (1984); laparoscopy (2011); Total ankle replacement (Left, 2015); and Colonoscopy with propofol (N/A, 07/15/2018).   Her family  history includes Brain cancer in her sister; Breast cancer in her paternal grandmother; Colon polyps in her father; Diabetes in her mother; Gallbladder disease  in her mother; Heart attack in her maternal grandfather; Heart disease in her father and mother; Irritable bowel syndrome in her mother; Pancreatic cancer (age of onset: 32) in her father; Stomach cancer in her paternal grandmother; Stroke in her maternal grandmother.She reports that she has never smoked. She has never used smokeless tobacco. She reports current alcohol use of about 3.0 - 4.0 standard drinks of alcohol per week. She reports that she does not use drugs.  Outpatient Medications Prior to Visit  Medication Sig Dispense Refill  . Ca & Phos-Vit D-Mag (CALCIUM) 213 147 2000 TABS Take by mouth daily.    . cholecalciferol (VITAMIN D) 1000 units tablet Take 1,000 Units by mouth daily.    . Naproxen Sodium (ALEVE) 220 MG CAPS Take by mouth as needed. Reported on 11/29/2015    . senna (SENOKOT) 8.6 MG tablet Take 2 tablets by mouth daily.    . lansoprazole (PREVACID) 30 MG capsule Take 1 capsule (30 mg total) by mouth daily. 30 capsule 10  . amoxicillin (AMOXIL) 500 MG capsule Take 4 capsules 2 hours prior to procedure (Patient not taking: Reported on 06/25/2019) 4 capsule 2  . Coenzyme Q10 (COQ10) 100 MG CAPS Take by mouth daily.    . fluconazole (DIFLUCAN) 150 MG tablet Take 1 tablet (150 mg total) by mouth daily. (Patient not taking: Reported on 06/25/2019) 2 tablet 0  . Multiple Vitamins-Minerals (MULTIVITAMIN WITH MINERALS) tablet Take 1 tablet by mouth daily. EHT vitamin replacement     No facility-administered medications prior to visit.     Review of Systems   Patient denies headache, fevers, malaise, unintentional weight loss, skin rash, eye pain, sinus congestion and sinus pain, sore throat, dysphagia,  hemoptysis , cough, dyspnea, wheezing, chest pain, palpitations, orthopnea, edema, abdominal pain, nausea, melena, diarrhea, constipation, flank pain, dysuria, hematuria, urinary  Frequency, nocturia, numbness, tingling, seizures,  Focal weakness, Loss of consciousness,  Tremor,  insomnia, depression, anxiety, and suicidal ideation.      Objective:  BP 118/72 (BP Location: Left Arm, Patient Position: Sitting, Cuff Size: Normal)   Pulse 82   Temp (!) 97.1 F (36.2 C) (Temporal)   Resp 14   Ht 5' (1.524 m)   Wt 145 lb 12.8 oz (66.1 kg)   SpO2 97%   BMI 28.47 kg/m   Physical Exam   General appearance: alert, cooperative and appears stated age Head: Normocephalic, without obvious abnormality, atraumatic Eyes: conjunctivae/corneas clear. PERRL, EOM's intact. Fundi benign. Ears: normal TM's and external ear canals both ears Nose: Nares normal. Septum midline. Mucosa normal. No drainage or sinus tenderness. Throat: lips, mucosa, and tongue normal; teeth and gums normal Neck: no adenopathy, no carotid bruit, no JVD, supple, symmetrical, trachea midline and thyroid not enlarged, symmetric, no tenderness/mass/nodules Lungs: clear to auscultation bilaterally Breasts: normal appearance, no masses or tenderness Heart: regular rate and rhythm, S1, S2 normal, no murmur, click, rub or gallop Abdomen: soft, non-tender; bowel sounds normal; no masses,  no organomegaly Extremities: extremities normal, atraumatic, no cyanosis or edema Pulses: 2+ and symmetric Skin: Skin color, texture, turgor normal. No rashes or lesions Neurologic: Alert and oriented X 3, normal strength and tone. Normal symmetric reflexes. Normal coordination and gait.      Assessment & Plan:   Problem List Items Addressed This Visit      Unprioritized   Reflux laryngitis  Managed with daily prevacid.  Checking b12/flate given chronic use.       Visit for preventive health examination    age appropriate education and counseling updated, referrals for preventative services and immunizations addressed, dietary and smoking counseling addressed, most recent labs reviewed.  I have personally reviewed and have noted:  1) the patient's medical and social history 2) The pt's use of alcohol, tobacco,  and illicit drugs 3) The patient's current medications and supplements 4) Functional ability including ADL's, fall risk, home safety risk, hearing and visual impairment 5) Diet and physical activities 6) Evidence for depression or mood disorder 7) The patient's height, weight, and BMI have been recorded in the chart  I have made referrals, and provided counseling and education based on review of the above      Vitamin D deficiency    Likely aggravated by PPI use for GERD with history of laryngitis . Continue supplementation and periodic surveillance       Atrophic vaginitis    Discussed the cause of atrophic vaginitis  and potential treatments,  Both hormonal and nonhormonal.  She has already tried  OTC lubricants .  Trial of estrogen cream.         Other Visit Diagnoses    Breast cancer screening    -  Primary   Relevant Orders   MM 3D SCREEN BREAST BILATERAL   COVID-19 virus infection       Relevant Orders   SAR CoV2 Serology (COVID 19)AB(IGG)IA (Completed)   Hyperlipidemia LDL goal <160       Relevant Orders   Lipid panel (Completed)   Encounter for preventive health examination       Relevant Orders   Comprehensive metabolic panel (Completed)   TSH (Completed)   CBC with Differential/Platelet (Completed)   Long-term use of high-risk medication       Relevant Orders   B12 and Folate Panel (Completed)   VITAMIN D 25 Hydroxy (Vit-D Deficiency, Fractures)      I have discontinued Dayna Barker multivitamin with minerals, CoQ10, amoxicillin, and fluconazole. I am also having her start on conjugated estrogens. Additionally, I am having her maintain her Naproxen Sodium, cholecalciferol, Calcium, senna, and lansoprazole.  Meds ordered this encounter  Medications  . lansoprazole (PREVACID) 30 MG capsule    Sig: Take 1 capsule (30 mg total) by mouth daily.    Dispense:  90 capsule    Refill:  3  . conjugated estrogens (PREMARIN) vaginal cream    Sig: 1 gram  intravaginally nightly for 2 weeks,  Then twice weekly thereafter    Dispense:  42.5 g    Refill:  12    Medications Discontinued During This Encounter  Medication Reason  . amoxicillin (AMOXIL) 500 MG capsule Completed Course  . fluconazole (DIFLUCAN) 150 MG tablet Completed Course  . Coenzyme Q10 (COQ10) 100 MG CAPS Patient has not taken in last 30 days  . Multiple Vitamins-Minerals (MULTIVITAMIN WITH MINERALS) tablet Patient has not taken in last 30 days  . lansoprazole (PREVACID) 30 MG capsule Reorder    Follow-up: No follow-ups on file.   Crecencio Mc, MD

## 2019-06-25 NOTE — Patient Instructions (Addendum)
Your annual mammogram has been ordered.  You are encouraged (required) to call to make your appointment at [facility]  Trial of vaginal estrogen for dryness (seNt to hARRIS tEETER)  Health Maintenance for Postmenopausal Women Menopause is a normal process in which your ability to get pregnant comes to an end. This process happens slowly over many months or years, usually between the ages of 75 and 54. Menopause is complete when you have missed your menstrual periods for 12 months. It is important to talk with your health care provider about some of the most common conditions that affect women after menopause (postmenopausal women). These include heart disease, cancer, and bone loss (osteoporosis). Adopting a healthy lifestyle and getting preventive care can help to promote your health and wellness. The actions you take can also lower your chances of developing some of these common conditions. What should I know about menopause? During menopause, you may get a number of symptoms, such as:  Hot flashes. These can be moderate or severe.  Night sweats.  Decrease in sex drive.  Mood swings.  Headaches.  Tiredness.  Irritability.  Memory problems.  Insomnia. Choosing to treat or not to treat these symptoms is a decision that you make with your health care provider. Do I need hormone replacement therapy?  Hormone replacement therapy is effective in treating symptoms that are caused by menopause, such as hot flashes and night sweats.  Hormone replacement carries certain risks, especially as you become older. If you are thinking about using estrogen or estrogen with progestin, discuss the benefits and risks with your health care provider. What is my risk for heart disease and stroke? The risk of heart disease, heart attack, and stroke increases as you age. One of the causes may be a change in the body's hormones during menopause. This can affect how your body uses dietary fats,  triglycerides, and cholesterol. Heart attack and stroke are medical emergencies. There are many things that you can do to help prevent heart disease and stroke. Watch your blood pressure  High blood pressure causes heart disease and increases the risk of stroke. This is more likely to develop in people who have high blood pressure readings, are of African descent, or are overweight.  Have your blood pressure checked: ? Every 3-5 years if you are 25-42 years of age. ? Every year if you are 79 years old or older. Eat a healthy diet   Eat a diet that includes plenty of vegetables, fruits, low-fat dairy products, and lean protein.  Do not eat a lot of foods that are high in solid fats, added sugars, or sodium. Get regular exercise Get regular exercise. This is one of the most important things you can do for your health. Most adults should:  Try to exercise for at least 150 minutes each week. The exercise should increase your heart rate and make you sweat (moderate-intensity exercise).  Try to do strengthening exercises at least twice each week. Do these in addition to the moderate-intensity exercise.  Spend less time sitting. Even light physical activity can be beneficial. Other tips  Work with your health care provider to achieve or maintain a healthy weight.  Do not use any products that contain nicotine or tobacco, such as cigarettes, e-cigarettes, and chewing tobacco. If you need help quitting, ask your health care provider.  Know your numbers. Ask your health care provider to check your cholesterol and your blood sugar (glucose). Continue to have your blood tested as  directed by your health care provider. Do I need screening for cancer? Depending on your health history and family history, you may need to have cancer screening at different stages of your life. This may include screening for:  Breast cancer.  Cervical cancer.  Lung cancer.  Colorectal cancer. What is my risk for  osteoporosis? After menopause, you may be at increased risk for osteoporosis. Osteoporosis is a condition in which bone destruction happens more quickly than new bone creation. To help prevent osteoporosis or the bone fractures that can happen because of osteoporosis, you may take the following actions:  If you are 71-55 years old, get at least 1,000 mg of calcium and at least 600 mg of vitamin D per day.  If you are older than age 8 but younger than age 77, get at least 1,200 mg of calcium and at least 600 mg of vitamin D per day.  If you are older than age 25, get at least 1,200 mg of calcium and at least 800 mg of vitamin D per day. Smoking and drinking excessive alcohol increase the risk of osteoporosis. Eat foods that are rich in calcium and vitamin D, and do weight-bearing exercises several times each week as directed by your health care provider. How does menopause affect my mental health? Depression may occur at any age, but it is more common as you become older. Common symptoms of depression include:  Low or sad mood.  Changes in sleep patterns.  Changes in appetite or eating patterns.  Feeling an overall lack of motivation or enjoyment of activities that you previously enjoyed.  Frequent crying spells. Talk with your health care provider if you think that you are experiencing depression. General instructions See your health care provider for regular wellness exams and vaccines. This may include:  Scheduling regular health, dental, and eye exams.  Getting and maintaining your vaccines. These include: ? Influenza vaccine. Get this vaccine each year before the flu season begins. ? Pneumonia vaccine. ? Shingles vaccine. ? Tetanus, diphtheria, and pertussis (Tdap) booster vaccine. Your health care provider may also recommend other immunizations. Tell your health care provider if you have ever been abused or do not feel safe at home. Summary  Menopause is a normal process in  which your ability to get pregnant comes to an end.  This condition causes hot flashes, night sweats, decreased interest in sex, mood swings, headaches, or lack of sleep.  Treatment for this condition may include hormone replacement therapy.  Take actions to keep yourself healthy, including exercising regularly, eating a healthy diet, watching your weight, and checking your blood pressure and blood sugar levels.  Get screened for cancer and depression. Make sure that you are up to date with all your vaccines. This information is not intended to replace advice given to you by your health care provider. Make sure you discuss any questions you have with your health care provider. Document Released: 11/16/2005 Document Revised: 09/17/2018 Document Reviewed: 09/17/2018 Elsevier Patient Education  2020 [employer].

## 2019-06-25 NOTE — Telephone Encounter (Signed)
Copied from [place] [id number]. Topic: Quick Communication - See Telephone Encounter >> Jun 25, 2019 10:36 AM Loma Boston wrote: CRM for notification. See Telephone encounter for: 06/25/19.pt left her badge in office after visit with [place], pls leave at front desk she will PU after 4:30 before 5:00. Thanks!

## 2019-06-26 LAB — CBC WITH DIFFERENTIAL/PLATELET
Basophils Absolute: 0.1 10*3/uL (ref 0.0–0.2)
Basos: 1 %
EOS (ABSOLUTE): 0.1 10*3/uL (ref 0.0–0.4)
Eos: 1 %
Hematocrit: 36.9 % (ref 34.0–46.6)
Hemoglobin: 12.6 g/dL (ref 11.1–15.9)
Immature Grans (Abs): 0 10*3/uL (ref 0.0–0.1)
Immature Granulocytes: 0 %
Lymphocytes Absolute: 3.1 10*3/uL (ref 0.7–3.1)
Lymphs: 41 %
MCH: 30.1 pg (ref 26.6–33.0)
MCHC: 34.1 g/dL (ref 31.5–35.7)
MCV: 88 fL (ref 79–97)
Monocytes Absolute: 0.6 10*3/uL (ref 0.1–0.9)
Monocytes: 8 %
Neutrophils Absolute: 3.6 10*3/uL (ref 1.4–7.0)
Neutrophils: 49 %
Platelets: 255 10*3/uL (ref 150–450)
RBC: 4.19 x10E6/uL (ref 3.77–5.28)
RDW: 12.1 % (ref 11.7–15.4)
WBC: 7.4 10*3/uL (ref 3.4–10.8)

## 2019-06-26 LAB — COMPREHENSIVE METABOLIC PANEL
ALT: 10 IU/L (ref 0–32)
AST: 17 IU/L (ref 0–40)
Albumin/Globulin Ratio: 1.4 (ref 1.2–2.2)
Albumin: 4.4 g/dL (ref 3.8–4.9)
Alkaline Phosphatase: 56 IU/L (ref 39–117)
BUN/Creatinine Ratio: 21 (ref 9–23)
BUN: 18 mg/dL (ref 6–24)
Bilirubin Total: 0.4 mg/dL (ref 0.0–1.2)
CO2: 23 mmol/L (ref 20–29)
Calcium: 9.8 mg/dL (ref 8.7–10.2)
Chloride: 106 mmol/L (ref 96–106)
Creatinine, Ser: 0.87 mg/dL (ref 0.57–1.00)
GFR calc Af Amer: 89 mL/min/{1.73_m2} (ref >59)
GFR calc non Af Amer: 77 mL/min/{1.73_m2} (ref >59)
Globulin, Total: 3.1 g/dL (ref 1.5–4.5)
Glucose: 96 mg/dL (ref 65–99)
Potassium: 4.4 mmol/L (ref 3.5–5.2)
Sodium: 144 mmol/L (ref 134–144)
Total Protein: 7.5 g/dL (ref 6.0–8.5)

## 2019-06-26 LAB — LIPID PANEL
Chol/HDL Ratio: 2.3 ratio (ref 0.0–4.4)
Cholesterol, Total: 241 mg/dL — ABNORMAL HIGH (ref 100–199)
HDL: 107 mg/dL (ref >39)
LDL Chol Calc (NIH): 125 mg/dL — ABNORMAL HIGH (ref 0–99)
Triglycerides: 54 mg/dL (ref 0–149)
VLDL Cholesterol Cal: 9 mg/dL (ref 5–40)

## 2019-06-26 LAB — TSH: TSH: 2.98 u[IU]/mL (ref 0.450–4.500)

## 2019-06-26 LAB — B12 AND FOLATE PANEL
Folate: 16.1 ng/mL (ref >3.0)
Vitamin B-12: 382 pg/mL (ref 232–1245)

## 2019-06-26 LAB — VITAMIN D 25 HYDROXY (VIT D DEFICIENCY, FRACTURES): Vit D, 25-Hydroxy: 25.5 ng/mL — ABNORMAL LOW (ref 30.0–100.0)

## 2019-06-26 LAB — SAR COV2 SEROLOGY (COVID19)AB(IGG),IA: SARS-CoV-2 Ab, IgG: NEGATIVE

## 2019-06-28 DIAGNOSIS — N952 Postmenopausal atrophic vaginitis: Secondary | ICD-10-CM | POA: Insufficient documentation

## 2019-06-28 NOTE — Assessment & Plan Note (Signed)

## 2019-06-28 NOTE — Assessment & Plan Note (Signed)
Managed with daily prevacid.  Checking b12/flate given chronic use.

## 2019-06-28 NOTE — Assessment & Plan Note (Signed)
Likely aggravated by PPI use for GERD with history of laryngitis . Continue supplementation and periodic surveillance

## 2019-06-28 NOTE — Assessment & Plan Note (Signed)
Discussed the cause of atrophic vaginitis  and potential treatments,  Both hormonal and nonhormonal.  She has already tried  OTC lubricants .  Trial of estrogen cream.

## 2019-07-10 ENCOUNTER — Encounter: Payer: Self-pay | Admitting: Internal Medicine

## 2020-11-10 ENCOUNTER — Other Ambulatory Visit: Payer: Self-pay | Admitting: Internal Medicine

## 2021-07-24 ENCOUNTER — Encounter: Payer: Self-pay | Admitting: Internal Medicine

## 2021-07-24 ENCOUNTER — Other Ambulatory Visit: Payer: Self-pay

## 2021-07-24 ENCOUNTER — Ambulatory Visit (INDEPENDENT_AMBULATORY_CARE_PROVIDER_SITE_OTHER): Admitting: Internal Medicine

## 2021-07-24 VITALS — BP 150/88 | HR 84 | Temp 96.3°F | Ht 60.0 in | Wt 146.6 lb

## 2021-07-24 DIAGNOSIS — Z0000 Encounter for general adult medical examination without abnormal findings: Secondary | ICD-10-CM | POA: Diagnosis not present

## 2021-07-24 DIAGNOSIS — R030 Elevated blood-pressure reading, without diagnosis of hypertension: Secondary | ICD-10-CM

## 2021-07-24 DIAGNOSIS — Z1231 Encounter for screening mammogram for malignant neoplasm of breast: Secondary | ICD-10-CM

## 2021-07-24 DIAGNOSIS — E663 Overweight: Secondary | ICD-10-CM | POA: Diagnosis not present

## 2021-07-24 DIAGNOSIS — Q059 Spina bifida, unspecified: Secondary | ICD-10-CM

## 2021-07-24 DIAGNOSIS — E559 Vitamin D deficiency, unspecified: Secondary | ICD-10-CM | POA: Diagnosis not present

## 2021-07-24 NOTE — Patient Instructions (Addendum)
  Try looking at [email or web address] for a good selection of compression garments  Bp is elevated today   BP goal 120/70  to 130/80  Suspend aleve for 72 hours and check BP at home  You can add up to 2000 mg of acetominophen (tylenol) every day safely  In divided doses (500 mg every 6 hours  Or 1000 mg every 12 hours.)

## 2021-07-24 NOTE — Progress Notes (Signed)
Patient ID: Leah Pearson, female    DOB: [birth year]  Age: 55 y.o. MRN: [id number]  The patient is here for annual preventive examination and management of other chronic and acute problems.  This visit occurred during the SARS-CoV-2 public health emergency.  Safety protocols were in place, including screening questions prior to the visit, additional usage of staff PPE, and extensive cleaning of exam room while observing appropriate contact time as indicated for disinfecting solutions.   The risk factors are reflected in the social history.  The roster of all physicians providing medical care to patient - is listed in the Snapshot section of the chart.  Activities of daily living:  The patient is 100% independent in all ADLs: dressing, toileting, feeding as well as independent mobility  Home safety : The patient has smoke detectors in the home. They wear seatbelts.  There are no firearms at home. There is no violence in the home.   There is no risks for hepatitis, STDs or HIV. There is no   history of blood transfusion. They have no travel history to infectious disease endemic areas of the world.  The patient has seen their dentist in the last six month. They have seen their eye doctor in the last year. They admit to slight hearing difficulty with regard to whispered voices and some television programs.  They have deferred audiologic testing in the last year.  They do not  have excessive sun exposure. Discussed the need for sun protection: hats, long sleeves and use of sunscreen if there is significant sun exposure.   Diet: the importance of a healthy diet is discussed. They do have a healthy diet.  The benefits of regular aerobic exercise were discussed. She has not been able to exercise due to her right ankle surgeries    Depression screen: there are no signs or vegative symptoms of depression- irritability, change in appetite, anhedonia, sadness/tearfullness.  The following portions of the  patient's history were reviewed and updated as appropriate: allergies, current medications, past family history, past medical history,  past surgical history, past social history  and problem list.  Visual acuity was not assessed per patient preference since she has regular follow up with her ophthalmologist. Hearing and body mass index were assessed and reviewed.   During the course of the visit the patient was educated and counseled about appropriate screening and preventive services including : fall prevention , diabetes screening, nutrition counseling, colorectal cancer screening, and recommended immunizations.    CC: The primary encounter diagnosis was Breast cancer screening by mammogram. Diagnoses of Elevated blood pressure reading in office without diagnosis of hypertension, Vitamin D deficiency, Overweight, Visit for preventive health examination, and Spina bifida without hydrocephalus, unspecified spinal region [facility]) were also pertinent to this visit.  History Avanell has a past medical history of Allergy, Arthritis, GERD (gastroesophageal reflux disease), UTI (urinary tract infection), Inflammatory polyps, Migraines, and Spina bifida ([place]).   She has a past surgical history that includes Abdominal hysterectomy (2011 ); Cervix removal (2013); Spinal fusion (1974); grise/green (1972); lowered arches (1975); Great toe arthrodesis, Jones procedure (1979); spinal fusion/eggshell/moll rods/herrington (1984); laparoscopy (2011); Total ankle replacement (Left, 2015); and Colonoscopy with propofol (N/A, 07/15/2018).   Her family history includes Brain cancer in her sister; Breast cancer in her paternal grandmother; Cancer (age of onset: 64) in her father; Colon polyps in her father; Diabetes in her mother; Gallbladder disease in her mother; Heart attack in her maternal grandfather; Heart disease in her father and mother;  Irritable bowel syndrome in her mother; Pancreatic cancer (age of onset: 30) in  her father; Stomach cancer in her paternal grandmother; Stroke in her maternal grandmother.She reports that she has never smoked. She has never used smokeless tobacco. She reports current alcohol use of about 3.0 - 4.0 standard drinks per week. She reports that she does not use drugs.  Outpatient Medications Prior to Visit  Medication Sig Dispense Refill   Ca & Phos-Vit D-Mag (CALCIUM) [id number] TABS Take by mouth daily.     cholecalciferol (VITAMIN D) 1000 units tablet Take 1,000 Units by mouth daily.     COLLAGEN PO Take 3 capsules by mouth daily.     Naproxen Sodium 220 MG CAPS Take by mouth as needed. Reported on 11/29/2015     senna (SENOKOT) 8.6 MG tablet Take 2 tablets by mouth daily.     conjugated estrogens (PREMARIN) vaginal cream 1 gram intravaginally nightly for 2 weeks,  Then twice weekly thereafter (Patient not taking: Reported on 07/24/2021) 42.5 g 12   lansoprazole (PREVACID) 30 MG capsule Take 1 capsule (30 mg total) by mouth daily. (Patient not taking: Reported on 07/24/2021) 90 capsule 3   No facility-administered medications prior to visit.    Review of Systems  Patient denies headache, fevers, malaise, unintentional weight loss, skin rash, eye pain, sinus congestion and sinus pain, sore throat, dysphagia,  hemoptysis , cough, dyspnea, wheezing, chest pain, palpitations, orthopnea, edema, abdominal pain, nausea, melena, diarrhea, constipation, flank pain, dysuria, hematuria, urinary  Frequency, nocturia, numbness, tingling, seizures,  Focal weakness, Loss of consciousness,  Tremor, insomnia, depression, anxiety, and suicidal ideation.     Objective:  BP (!) 150/88 (BP Location: Left Arm, Patient Position: Sitting, Cuff Size: Normal)   Pulse 84   Temp (!) 96.3 F (35.7 C) (Temporal)   Ht 5' (1.524 m)   Wt 146 lb 9.6 oz (66.5 kg)   SpO2 98%   BMI 28.63 kg/m   Physical Exam  General appearance: alert, cooperative and appears stated age Head: Normocephalic,  without obvious abnormality, atraumatic Eyes: conjunctivae/corneas clear. PERRL, EOM's intact. Fundi benign. Ears: normal TM's and external ear canals both ears Nose: Nares normal. Septum midline. Mucosa normal. No drainage or sinus tenderness. Throat: lips, mucosa, and tongue normal; teeth and gums normal Neck: no adenopathy, no carotid bruit, no JVD, supple, symmetrical, trachea midline and thyroid not enlarged, symmetric, no tenderness/mass/nodules Lungs: clear to auscultation bilaterally Breasts: normal appearance, no masses or tenderness Heart: regular rate and rhythm, S1, S2 normal, no murmur, click, rub or gallop Abdomen: soft, non-tender; bowel sounds normal; no masses,  no organomegaly Extremities: extremities normal, atraumatic, no cyanosis or edema Pulses: 2+ and symmetric Skin: Skin color, texture, turgor normal. No rashes or lesions Neurologic: Alert and oriented X 3, normal strength and tone. Normal symmetric reflexes. Normal coordination and gait.     Assessment & Plan:   Problem List Items Addressed This Visit       Unprioritized   Vitamin D deficiency   Relevant Orders   VITAMIN D 25 Hydroxy (Vit-D Deficiency, Fractures) (Completed)   Overweight   Relevant Orders   Lipid panel (Completed)   TSH (Completed)   Spina bifida ([place])    S/p multiple prior orthopedic surgeries.  She is currently recovering from another ankle surgery which required osteotomy and non weight bearing status      Visit for preventive health examination    age appropriate education and counseling updated, referrals for preventative services and immunizations addressed,  dietary and smoking counseling addressed, most recent labs reviewed.  I have personally reviewed and have noted:   1) the patient's medical and social history 2) The pt's use of alcohol, tobacco, and illicit drugs 3) The patient's current medications and supplements 4) Functional ability including ADL's, fall risk, home safety  risk, hearing and visual impairment 5) Diet and physical activities 6) Evidence for depression or mood disorder 7) The patient's height, weight, and BMI have been recorded in the chart   I have made referrals, and provided counseling and education based on review of the above      Other Visit Diagnoses     Breast cancer screening by mammogram    -  Primary   Relevant Orders   MM 3D SCREEN BREAST BILATERAL   Elevated blood pressure reading in office without diagnosis of hypertension       Relevant Orders   Comprehensive metabolic panel (Completed)   Microalbumin / creatinine urine ratio (Completed)       I have discontinued Priscila Macauley's lansoprazole and conjugated estrogens. I am also having her maintain her Naproxen Sodium, cholecalciferol, Calcium, senna, and COLLAGEN PO.  No orders of the defined types were placed in this encounter.   Medications Discontinued During This Encounter  Medication Reason   conjugated estrogens (PREMARIN) vaginal cream    lansoprazole (PREVACID) 30 MG capsule     Follow-up: No follow-ups on file.   Crecencio Mc, MD

## 2021-07-25 ENCOUNTER — Encounter: Payer: Self-pay | Admitting: Internal Medicine

## 2021-07-25 LAB — COMPREHENSIVE METABOLIC PANEL
ALT: 9 IU/L (ref 0–32)
AST: 19 IU/L (ref 0–40)
Albumin/Globulin Ratio: 1.5 (ref 1.2–2.2)
Albumin: 4.3 g/dL (ref 3.8–4.9)
Alkaline Phosphatase: 73 IU/L (ref 44–121)
BUN/Creatinine Ratio: 12 (ref 9–23)
BUN: 10 mg/dL (ref 6–24)
Bilirubin Total: 0.7 mg/dL (ref 0.0–1.2)
CO2: 22 mmol/L (ref 20–29)
Calcium: 9.5 mg/dL (ref 8.7–10.2)
Chloride: 105 mmol/L (ref 96–106)
Creatinine, Ser: 0.81 mg/dL (ref 0.57–1.00)
Globulin, Total: 2.8 g/dL (ref 1.5–4.5)
Glucose: 88 mg/dL (ref 70–99)
Potassium: 4.2 mmol/L (ref 3.5–5.2)
Sodium: 142 mmol/L (ref 134–144)
Total Protein: 7.1 g/dL (ref 6.0–8.5)
eGFR: 87 mL/min/{1.73_m2} (ref >59)

## 2021-07-25 LAB — LIPID PANEL
Chol/HDL Ratio: 2.2 ratio (ref 0.0–4.4)
Cholesterol, Total: 214 mg/dL — ABNORMAL HIGH (ref 100–199)
HDL: 99 mg/dL (ref >39)
LDL Chol Calc (NIH): 100 mg/dL — ABNORMAL HIGH (ref 0–99)
Triglycerides: 85 mg/dL (ref 0–149)
VLDL Cholesterol Cal: 15 mg/dL (ref 5–40)

## 2021-07-25 LAB — VITAMIN D 25 HYDROXY (VIT D DEFICIENCY, FRACTURES): Vit D, 25-Hydroxy: 28.6 ng/mL — ABNORMAL LOW (ref 30.0–100.0)

## 2021-07-25 LAB — MICROALBUMIN / CREATININE URINE RATIO
Creatinine, Urine: 20.6 mg/dL
Microalb/Creat Ratio: <15 mg/g creat (ref 0–29)
Microalbumin, Urine: <3 ug/mL

## 2021-07-25 LAB — TSH: TSH: 2.23 u[IU]/mL (ref 0.450–4.500)

## 2021-07-25 NOTE — Progress Notes (Signed)
  Your  thyroid , cholesterol, liver and kidney function are all  normal. Your vitamin D is a little  low, which can increase your risk of weak bones and fractures and interfere with your body's ability to absorb the calcium in your diet.. you should increase your  OTC  Vit D3 supplement  to 2000 units daily.   Regards,    Dr. Derrel Nip

## 2021-07-25 NOTE — Assessment & Plan Note (Signed)

## 2021-07-25 NOTE — Assessment & Plan Note (Signed)
S/p multiple prior orthopedic surgeries.  She is currently recovering from another ankle surgery which required osteotomy and non weight bearing status

## 2022-02-16 ENCOUNTER — Encounter: Payer: Self-pay | Admitting: Internal Medicine

## 2022-09-18 NOTE — Telephone Encounter (Signed)
MyChart messgae sent to patient. 

## 2023-06-26 ENCOUNTER — Telehealth: Payer: Self-pay

## 2023-06-26 DIAGNOSIS — Z1231 Encounter for screening mammogram for malignant neoplasm of breast: Secondary | ICD-10-CM

## 2023-06-26 NOTE — Telephone Encounter (Signed)
Mammogram has been ordered. Pt is aware and was given number to schedule mammogram.

## 2023-07-04 ENCOUNTER — Ambulatory Visit
Admission: RE | Admit: 2023-07-04 | Discharge: 2023-07-04 | Disposition: A | Discharge status: Home / Self Care | Source: Ambulatory Visit | Attending: Internal Medicine | Admitting: Internal Medicine

## 2023-07-04 DIAGNOSIS — Z1231 Encounter for screening mammogram for malignant neoplasm of breast: Secondary | ICD-10-CM | POA: Insufficient documentation

## 2023-07-19 ENCOUNTER — Ambulatory Visit (INDEPENDENT_AMBULATORY_CARE_PROVIDER_SITE_OTHER): Admitting: Internal Medicine

## 2023-07-19 VITALS — BP 120/76 | HR 82 | Ht 60.0 in | Wt 145.0 lb

## 2023-07-19 DIAGNOSIS — N62 Hypertrophy of breast: Secondary | ICD-10-CM | POA: Insufficient documentation

## 2023-07-19 DIAGNOSIS — E559 Vitamin D deficiency, unspecified: Secondary | ICD-10-CM

## 2023-07-19 DIAGNOSIS — Z809 Family history of malignant neoplasm, unspecified: Secondary | ICD-10-CM

## 2023-07-19 DIAGNOSIS — Z1211 Encounter for screening for malignant neoplasm of colon: Secondary | ICD-10-CM | POA: Diagnosis not present

## 2023-07-19 DIAGNOSIS — D122 Benign neoplasm of ascending colon: Secondary | ICD-10-CM | POA: Diagnosis not present

## 2023-07-19 DIAGNOSIS — Z0000 Encounter for general adult medical examination without abnormal findings: Secondary | ICD-10-CM

## 2023-07-19 DIAGNOSIS — E663 Overweight: Secondary | ICD-10-CM | POA: Diagnosis not present

## 2023-07-19 NOTE — Assessment & Plan Note (Addendum)
She is experiencing back pain and bilateral shoulder pain due to her bra straps CREATING  grooves in both shoulders from carrying DDD size breasts.  BMI is <30

## 2023-07-19 NOTE — Patient Instructions (Signed)
I have made referrals to [facility] and the cancer center for genetic testing.   Bring your "23 and me" results your to genetics consultation

## 2023-07-19 NOTE — Assessment & Plan Note (Signed)
She is due for 5 yr follow up

## 2023-07-19 NOTE — Progress Notes (Signed)
Patient ID: Leah Pearson, female    DOB: 1965/10/31  Age: 57 y.o. MRN: [id number]  The patient is here for annual preventive examination and management of other chronic and acute problems.   The risk factors are reflected in the social history.   The roster of all physicians providing medical care to patient - is listed in the Snapshot section of the chart.   Activities of daily living:  The patient is 100% independent in all ADLs: dressing, toileting, feeding as well as independent mobility   Home safety : The patient has smoke detectors in the home. They wear seatbelts.  There are no unsecured firearms at home. There is no violence in the home.    There is no risks for hepatitis, STDs or HIV. There is no   history of blood transfusion. They have no travel history to infectious disease endemic areas of the world.   The patient has seen their dentist in the last six month. They have seen their eye doctor in the last year. The patinet  denies slight hearing difficulty with regard to whispered voices and some television programs.  They have deferred audiologic testing in the last year.  They do not  have excessive sun exposure. Discussed the need for sun protection: hats, long sleeves and use of sunscreen if there is significant sun exposure.    Diet: the importance of a healthy diet is discussed. They do have a healthy diet.   The benefits of regular aerobic exercise were discussed. The patient  exercises  3 to 5 days per week  for  60 minutes.    Depression screen: there are no signs or vegative symptoms of depression- irritability, change in appetite, anhedonia, sadness/tearfullness.   The following portions of the patient's history were reviewed and updated as appropriate: allergies, current medications, past family history, past medical history,  past surgical history, past social history  and problem list.   Visual acuity was not assessed per patient preference since the patient has  regular follow up with an  ophthalmologist. Hearing and body mass index were assessed and reviewed.    During the course of the visit the patient was educated and counseled about appropriate screening and preventive services including : fall prevention , diabetes screening, nutrition counseling, colorectal cancer screening, and recommended immunizations.    Chief Complaint:  no issue  mammogram done within the last month.  Colonoscopy due  Losing weight by home scales .  Taking multi vitamins.   working out   [employer]:  system:  Runner, broadcasting/film/video, yoga .pilates fusion .Marland [employer]   Has improved her leg strength,  now able to hike 2 miles  with better stability   Back and shoulder pain due to Breast hypertrophy:  history of spine surgery due to spina bifida,  breast size is now triple D and cutting into h  er shoulders  and causing back pain .  Review of Symptoms  Patient denies headache, fevers, malaise, unintentional weight loss, skin rash, eye pain, sinus congestion and sinus pain, sore throat, dysphagia,  hemoptysis , cough, dyspnea, wheezing, chest pain, palpitations, orthopnea, edema, abdominal pain, nausea, melena, diarrhea, constipation, flank pain, dysuria, hematuria, urinary  Frequency, nocturia, numbness, tingling, seizures,  Focal weakness, Loss of consciousness,  Tremor, insomnia, depression, anxiety, and suicidal ideation.    Physical Exam:  BP 120/76   Pulse 82   Ht 5' (1.524 m)   Wt 145 lb (65.8 kg)   SpO2 98%   BMI 28.32  kg/m    Physical Exam Vitals reviewed.  Constitutional:      General: She is not in acute distress.    Appearance: Normal appearance. She is normal weight. She is not ill-appearing, toxic-appearing or diaphoretic.  HENT:     Head: Normocephalic.  Eyes:     General: No scleral icterus.       Right eye: No discharge.        Left eye: No discharge.     Conjunctiva/sclera: Conjunctivae normal.  Cardiovascular:     Rate and Rhythm: Normal rate and regular  rhythm.     Heart sounds: Normal heart sounds.  Pulmonary:     Effort: Pulmonary effort is normal. No respiratory distress.     Breath sounds: Normal breath sounds.  Musculoskeletal:        General: Normal range of motion.  Skin:    General: Skin is warm and dry.  Neurological:     General: No focal deficit present.     Mental Status: She is alert and oriented to person, place, and time. Mental status is at baseline.  Psychiatric:        Mood and Affect: Mood normal.        Behavior: Behavior normal.        Thought Content: Thought content normal.        Judgment: Judgment normal.    Assessment and Plan: Family history of cancer -     Ambulatory referral to Genetics  Screening for colon cancer -     Ambulatory referral to Gastroenterology  Overweight -     CBC with Differential/Platelet -     Comprehensive metabolic panel -     Lipid panel -     LDL cholesterol, direct -     TSH -     Hemoglobin A1c  Visit for preventive health examination Assessment & Plan: age appropriate education and counseling updated, referrals for preventative services and immunizations addressed, dietary and smoking counseling addressed, most recent labs reviewed.  I have personally reviewed and have noted:   1) the patient's medical and social history 2) The pt's use of alcohol, tobacco, and illicit drugs 3) The patient's current medications and supplements 4) Functional ability including ADL's, fall risk, home safety risk, hearing and visual impairment 5) Diet and physical activities 6) Evidence for depression or mood disorder 7) The patient's height, weight, and BMI have been recorded in the chart   I have made referrals, and provided counseling and education based on review of the above    Benign neoplasm of ascending colon Assessment & Plan: She is due for 5 yr follow up    Vitamin D deficiency -     VITAMIN D 25 Hydroxy (Vit-D Deficiency, Fractures)  Breast hypertrophy in  female Assessment & Plan: She is experiencing back pain and bilateral shoulder pain due to her bra straps CREATING  grooves in both shoulders from carrying DDD size breasts.  BMI is <30     Return in about 1 year (around 07/18/2024).  Sherlene Shams, MD

## 2023-07-20 LAB — CBC WITH DIFFERENTIAL/PLATELET
Basophils Absolute: 0.1 10*3/uL (ref 0.0–0.2)
Basos: 1 %
EOS (ABSOLUTE): 0.2 10*3/uL (ref 0.0–0.4)
Eos: 2 %
Hematocrit: 38.4 % (ref 34.0–46.6)
Hemoglobin: 12.7 g/dL (ref 11.1–15.9)
Immature Grans (Abs): 0 10*3/uL (ref 0.0–0.1)
Immature Granulocytes: 0 %
Lymphocytes Absolute: 3 10*3/uL (ref 0.7–3.1)
Lymphs: 35 %
MCH: 30.1 pg (ref 26.6–33.0)
MCHC: 33.1 g/dL (ref 31.5–35.7)
MCV: 91 fL (ref 79–97)
Monocytes Absolute: 0.7 10*3/uL (ref 0.1–0.9)
Monocytes: 8 %
Neutrophils Absolute: 4.7 10*3/uL (ref 1.4–7.0)
Neutrophils: 54 %
Platelets: 261 10*3/uL (ref 150–450)
RBC: 4.22 x10E6/uL (ref 3.77–5.28)
RDW: 12.5 % (ref 11.7–15.4)
WBC: 8.8 10*3/uL (ref 3.4–10.8)

## 2023-07-20 LAB — COMPREHENSIVE METABOLIC PANEL
ALT: 13 [IU]/L (ref 0–32)
AST: 19 [IU]/L (ref 0–40)
Albumin: 4.3 g/dL (ref 3.8–4.9)
Alkaline Phosphatase: 62 [IU]/L (ref 44–121)
BUN/Creatinine Ratio: 17 (ref 9–23)
BUN: 17 mg/dL (ref 6–24)
Bilirubin Total: 0.4 mg/dL (ref 0.0–1.2)
CO2: 23 mmol/L (ref 20–29)
Calcium: 10 mg/dL (ref 8.7–10.2)
Chloride: 103 mmol/L (ref 96–106)
Creatinine, Ser: 0.98 mg/dL (ref 0.57–1.00)
Globulin, Total: 2.9 g/dL (ref 1.5–4.5)
Glucose: 88 mg/dL (ref 70–99)
Potassium: 4.8 mmol/L (ref 3.5–5.2)
Sodium: 141 mmol/L (ref 134–144)
Total Protein: 7.2 g/dL (ref 6.0–8.5)
eGFR: 68 mL/min/{1.73_m2} (ref >59)

## 2023-07-20 LAB — LIPID PANEL
Chol/HDL Ratio: 2.6 {ratio} (ref 0.0–4.4)
Cholesterol, Total: 235 mg/dL — ABNORMAL HIGH (ref 100–199)
HDL: 92 mg/dL (ref >39)
LDL Chol Calc (NIH): 119 mg/dL — ABNORMAL HIGH (ref 0–99)
Triglycerides: 141 mg/dL (ref 0–149)
VLDL Cholesterol Cal: 24 mg/dL (ref 5–40)

## 2023-07-20 LAB — LDL CHOLESTEROL, DIRECT: LDL Direct: 124 mg/dL — ABNORMAL HIGH (ref 0–99)

## 2023-07-20 LAB — TSH: TSH: 2.53 u[IU]/mL (ref 0.450–4.500)

## 2023-07-20 LAB — VITAMIN D 25 HYDROXY (VIT D DEFICIENCY, FRACTURES): Vit D, 25-Hydroxy: 35 ng/mL (ref 30.0–100.0)

## 2023-07-20 NOTE — Assessment & Plan Note (Signed)

## 2023-07-24 ENCOUNTER — Other Ambulatory Visit: Payer: Self-pay | Admitting: *Deleted

## 2023-07-24 ENCOUNTER — Telehealth: Payer: Self-pay | Admitting: *Deleted

## 2023-07-24 DIAGNOSIS — Z860100 Personal history of colon polyps, unspecified: Secondary | ICD-10-CM

## 2023-07-24 MED ORDER — NA SULFATE-K SULFATE-MG SULF 17.5-3.13-1.6 GM/177ML PO SOLN: 1.0000 | Freq: Once | ORAL | 0 refills | Status: AC

## 2023-07-24 NOTE — Telephone Encounter (Signed)
Gastroenterology Pre-Procedure Review  Request Date: 08/23/2023 Requesting Physician: Dr. Servando Snare  PATIENT REVIEW QUESTIONS: The patient responded to the following health history questions as indicated:    1. Are you having any GI issues? no 2. Do you have a personal history of Polyps? yes (last colonoscopy with 2 polyps on 07/15/2018) 3. Do you have a family history of Colon Cancer or Polyps? no 4. Diabetes Mellitus? no 5. Joint replacements in the past 12 months?no 6. Major health problems in the past 3 months?no 7. Any artificial heart valves, MVP, or defibrillator?no    MEDICATIONS & ALLERGIES:    Patient reports the following regarding taking any anticoagulation/antiplatelet therapy:   Plavix, Coumadin, Eliquis, Xarelto, Lovenox, Pradaxa, Brilinta, or Effient? no Aspirin? no  Patient confirms/reports the following medications:  Current Outpatient Medications  Medication Sig Dispense Refill   Ca & Phos-Vit D-Mag (CALCIUM) [id number] TABS Take by mouth daily.     cholecalciferol (VITAMIN D) 1000 units tablet Take 1,000 Units by mouth daily.     COLLAGEN PO Take 3 capsules by mouth daily.     senna (SENOKOT) 8.6 MG tablet Take 2 tablets by mouth daily.     No current facility-administered medications for this visit.    Patient confirms/reports the following allergies:  Allergies  Allergen Reactions   Ferra-Caps [Iron] Rash    No orders of the defined types were placed in this encounter.   AUTHORIZATION INFORMATION Primary Insurance: 1D#: Group #:  Secondary Insurance: 1D#: Group #:  SCHEDULE INFORMATION: Date: 08/23/2023 Time: Location:  [facility]

## 2023-07-31 ENCOUNTER — Inpatient Hospital Stay: Payer: Managed Care, Other (non HMO) | Admitting: Licensed Clinical Social Worker

## 2023-07-31 ENCOUNTER — Inpatient Hospital Stay: Payer: Managed Care, Other (non HMO)

## 2023-08-13 ENCOUNTER — Encounter: Payer: Self-pay | Admitting: Gastroenterology

## 2023-08-13 ENCOUNTER — Encounter: Payer: Self-pay | Admitting: Licensed Clinical Social Worker

## 2023-08-13 ENCOUNTER — Inpatient Hospital Stay: Payer: Managed Care, Other (non HMO)

## 2023-08-13 ENCOUNTER — Inpatient Hospital Stay: Payer: Managed Care, Other (non HMO) | Attending: Oncology | Admitting: Licensed Clinical Social Worker

## 2023-08-13 DIAGNOSIS — Z8052 Family history of malignant neoplasm of bladder: Secondary | ICD-10-CM

## 2023-08-13 DIAGNOSIS — Z800 Family history of malignant neoplasm of digestive organs: Secondary | ICD-10-CM | POA: Diagnosis not present

## 2023-08-13 DIAGNOSIS — Z803 Family history of malignant neoplasm of breast: Secondary | ICD-10-CM

## 2023-08-13 NOTE — Progress Notes (Signed)
REFERRING PROVIDER: Sherlene Shams, MD [address] [place],  [place] [address]  PRIMARY PROVIDER:  Sherlene Shams, MD  PRIMARY REASON FOR VISIT:  1. Family history of pancreatic cancer   2. Family history of breast cancer   3. Family history of bladder cancer   4. Family history of stomach cancer      HISTORY OF PRESENT ILLNESS:   Leah Pearson, a 57 y.o. female, was seen for a [place] cancer genetics consultation at the request of Dr. Darrick Huntsman due to a family history of cancer.  Leah Pearson presents to clinic today to discuss the possibility of a hereditary predisposition to cancer, genetic testing, and to further clarify her future cancer risks, as well as potential cancer risks for family members.    CANCER HISTORY:   Leah Pearson is a 57 y.o. female with no personal history of cancer.    RISK FACTORS:  Menarche was at age 69.  Ovaries intact: yes.  Hysterectomy: yes at 51  Menopausal status: postmenopausal. -45 HRT use: 0 years. Colonoscopy: yes;  2019 , 2 tubular adenomas. Mammogram within the last year: yes. - density B Number of breast biopsies: 0.  Past Medical History:  Diagnosis Date   Allergy    Arthritis    GERD (gastroesophageal reflux disease)    with esophagitis   Hx: UTI (urinary tract infection)    Inflammatory polyps    located in gall bladder   Migraines    Spina bifida [facility])     Past Surgical History:  Procedure Laterality Date   ABDOMINAL HYSTERECTOMY  2011    CERVIX REMOVAL  2013   COLONOSCOPY WITH PROPOFOL N/A 07/15/2018   Procedure: COLONOSCOPY WITH PROPOFOL;  Surgeon: Midge Minium, MD;  Location: [facility] ENDOSCOPY;  Service: Endoscopy;  Laterality: N/A;   GREAT TOE ARTHRODESIS, JONES PROCEDURE  1979   grise/green  1972   LAPAROSCOPY  2011   with hysterectomy   lowered arches  1975   SPINAL FUSION  1974   spinal fusion/eggshell/moll rods/herrington  1984   TOTAL ANKLE REPLACEMENT Left 2015    FAMILY HISTORY:  We obtained a  detailed, 4-generation family history.  Significant diagnoses are listed below: Family History  Problem Relation Age of Onset   Diabetes Mother    Heart disease Mother        valvular dz no replacement   Irritable bowel syndrome Mother    Gallbladder disease Mother    Cancer Father 92       bladder cancer, pancreatic cancer   Heart disease Father    Pancreatic cancer Father 49   Colon polyps Father    Brain cancer Sister    Stroke Maternal Grandmother    Heart attack Maternal Grandfather    Stomach cancer Paternal Grandmother    Breast cancer Paternal Grandmother    Colon cancer Neg Hx    Esophageal cancer Neg Hx    Rectal cancer Neg Hx    Leah Pearson has 1 full sister, 39, 1 paternal half sister, 66, 1 paternal half brother, 5, no cancers.  Leah Pearson mother is living at 76. A maternal cousin had lung cancer at 28 and is living at 7. No other known cancers on this side of the family.  Leah Pearson father had bladder cancer at 51, pancreatic cancer at 51 and passed from metastatic disease at 52. Patient notes he was exposed to [employer]. Paternal grandmother had stomach and breast cancer at 50 and passed  at 34.  [name] is unaware of previous family history of genetic testing for hereditary cancer risks. There is no reported Ashkenazi Jewish ancestry. There is no known consanguinity.    GENETIC COUNSELING ASSESSMENT: Leah Pearson is a 57 y.o. female with a family history of pancreatic cancer which is somewhat suggestive of a hereditary cancer syndrome and predisposition to cancer. We, therefore, discussed and recommended the following at today's visit.   DISCUSSION: We discussed that approximately 10% of pancreatic cancer is hereditary. Most cases of hereditary pancreatic cancer are associated with BRCA genes, although there are other genes associated with hereditary pancreatic cancer as well including the Lynch syndrome genes which can also increase risk for bladder/stomach  cancer.. Cancers and risks are gene specific. We discussed that testing is beneficial for several reasons including knowing about cancer risks, identifying potential screening and risk-reduction options that may be appropriate, and to understand if other family members could be at risk for cancer and allow them to undergo genetic testing.   We reviewed the characteristics, features and inheritance patterns of hereditary cancer syndromes. We also discussed genetic testing, including the appropriate family members to test, the process of testing, insurance coverage and turn-around-time for results. We discussed the implications of a negative, positive and/or variant of uncertain significant result. We recommended Leah Pearson pursue genetic testing for the [facility] CancerNext-Expanded+RNA gene panel.   Based on Leah Pearson family history of cancer, she meets medical criteria for genetic testing. Despite that she meets criteria, she may still have an out of pocket cost.  PLAN: After considering the risks, benefits, and limitations, Leah Pearson provided informed consent to pursue genetic testing and the blood sample was sent to [facility] for analysis of the CancerNext-Expanded+RNA panel. Results should be available within approximately 2-3 weeks' time, at which point they will be disclosed by telephone to Leah Pearson, as will any additional recommendations warranted by these results. Leah Pearson will receive a summary of her genetic counseling visit and a copy of her results once available. This information will also be available in Epic.   Leah Pearson questions were answered to her satisfaction today. Our contact information was provided should additional questions or concerns arise. Thank you for the referral and allowing [place] to share in the care of your patient.   Lacy Duverney, MS, [facility] Genetic Counselor [email or web address] Phone: [phone]  The patient was seen for a total of 20 minutes in  face-to-face genetic counseling.  Dr. Blake Divine was available for discussion regarding this case.   _______________________________________________________________________ For Office Staff:  Number of people involved in session: 1 Was an Intern/ student involved with case: no

## 2023-08-18 ENCOUNTER — Encounter: Payer: Self-pay | Admitting: Gastroenterology

## 2023-08-18 NOTE — Anesthesia Preprocedure Evaluation (Addendum)
Anesthesia Evaluation  Patient identified by MRN, date of birth, ID band Patient awake    Reviewed: Allergy & Precautions, H&P , NPO status , Patient's Chart, lab work & pertinent test results  Airway Mallampati: II  TM Distance: >3 FB Neck ROM: Full    Dental no notable dental hx.  Permanent bridge right lower jaw:   Pulmonary neg pulmonary ROS   Pulmonary exam normal breath sounds clear to auscultation       Cardiovascular negative cardio ROS Normal cardiovascular exam Rhythm:Regular Rate:Normal     Neuro/Psych  Headaches  negative psych ROS   GI/Hepatic negative GI ROS, Neg liver ROS,GERD  ,,  Endo/Other  negative endocrine ROS    Renal/GU negative Renal ROS  negative genitourinary   Musculoskeletal  (+) Arthritis ,    Abdominal   Peds negative pediatric ROS (+)  Hematology negative hematology ROS (+)   Anesthesia Other Findings Migraines  Allergy Inflammatory polyps  Hx: UTI (urinary tract infection) GERD (gastroesophageal reflux disease) Arthritis Spina bifida (HCC)  Hx tethered spinal cord  Reproductive/Obstetrics negative OB ROS                             Anesthesia Physical Anesthesia Plan  ASA: 2  Anesthesia Plan: General   Post-op Pain Management:    Induction: Intravenous  PONV Risk Score and Plan:   Airway Management Planned: Natural Airway and Nasal Cannula  Additional Equipment:   Intra-op Plan:   Post-operative Plan:   Informed Consent: I have reviewed the patients History and Physical, chart, labs and discussed the procedure including the risks, benefits and alternatives for the proposed anesthesia with the patient or authorized representative who has indicated his/her understanding and acceptance.     Dental Advisory Given  Plan Discussed with: Anesthesiologist, CRNA and Surgeon  Anesthesia Plan Comments: (Patient consented for risks of  anesthesia including but not limited to:  - adverse reactions to medications - risk of airway placement if required - damage to eyes, teeth, lips or other oral mucosa - nerve damage due to positioning  - sore throat or hoarseness - Damage to heart, brain, nerves, lungs, other parts of body or loss of life  Patient voiced understanding and assent.)       Anesthesia Quick Evaluation

## 2023-08-23 ENCOUNTER — Other Ambulatory Visit: Payer: Self-pay

## 2023-08-23 ENCOUNTER — Encounter: Payer: Self-pay | Admitting: Gastroenterology

## 2023-08-23 ENCOUNTER — Encounter
Admission: RE | Disposition: A | Discharge status: Home / Self Care | Payer: Self-pay | Source: Home / Self Care | Attending: Gastroenterology

## 2023-08-23 ENCOUNTER — Ambulatory Visit: Payer: Managed Care, Other (non HMO) | Admitting: Anesthesiology

## 2023-08-23 ENCOUNTER — Ambulatory Visit
Admission: RE | Admit: 2023-08-23 | Discharge: 2023-08-23 | Disposition: A | Discharge status: Home / Self Care | Payer: Managed Care, Other (non HMO) | Attending: Gastroenterology | Admitting: Gastroenterology

## 2023-08-23 DIAGNOSIS — Z860100 Personal history of colon polyps, unspecified: Secondary | ICD-10-CM

## 2023-08-23 DIAGNOSIS — Z1211 Encounter for screening for malignant neoplasm of colon: Secondary | ICD-10-CM | POA: Diagnosis present

## 2023-08-23 DIAGNOSIS — Z860101 Personal history of adenomatous and serrated colon polyps: Secondary | ICD-10-CM | POA: Insufficient documentation

## 2023-08-23 DIAGNOSIS — K640 First degree hemorrhoids: Secondary | ICD-10-CM | POA: Insufficient documentation

## 2023-08-23 HISTORY — PX: COLONOSCOPY WITH PROPOFOL: SHX5780

## 2023-08-23 HISTORY — DX: Personal history of other diseases of the nervous system and sense organs: Z86.69

## 2023-08-23 SURGERY — COLONOSCOPY WITH PROPOFOL
Anesthesia: General

## 2023-08-23 MED ORDER — PROPOFOL 10 MG/ML IV BOLUS
INTRAVENOUS | Status: DC | PRN
  Administered 2023-08-23: 40 mg via INTRAVENOUS
  Administered 2023-08-23: 100 mg via INTRAVENOUS
  Administered 2023-08-23 (×2): 40 mg via INTRAVENOUS

## 2023-08-23 MED ORDER — LIDOCAINE HCL (CARDIAC) PF 100 MG/5ML IV SOSY
PREFILLED_SYRINGE | INTRAVENOUS | Status: DC | PRN
  Administered 2023-08-23: 50 mg via INTRAVENOUS

## 2023-08-23 MED ORDER — SODIUM CHLORIDE 0.9 % IV SOLN: INTRAVENOUS | Status: DC

## 2023-08-23 SURGICAL SUPPLY — 6 items
GOWN CVR UNV OPN BCK APRN NK (MISCELLANEOUS) ×2 IMPLANT
GOWN ISOL THUMB LOOP REG UNIV (MISCELLANEOUS) ×2
KIT PRC NS LF DISP ENDO (KITS) ×1 IMPLANT
KIT PROCEDURE OLYMPUS (KITS) ×1
MANIFOLD NEPTUNE II (INSTRUMENTS) ×1 IMPLANT
WATER STERILE IRR 250ML POUR (IV SOLUTION) ×1 IMPLANT

## 2023-08-23 NOTE — Anesthesia Postprocedure Evaluation (Signed)
Anesthesia Post Note  Patient: Khaliyah Burrous  Procedure(s) Performed: COLONOSCOPY WITH PROPOFOL  Patient location during evaluation: PACU Anesthesia Type: General Level of consciousness: awake and alert Pain management: pain level controlled Vital Signs Assessment: post-procedure vital signs reviewed and stable Respiratory status: spontaneous breathing, nonlabored ventilation, respiratory function stable and patient connected to nasal cannula oxygen Cardiovascular status: blood pressure returned to baseline and stable Postop Assessment: no apparent nausea or vomiting Anesthetic complications: no   No notable events documented.   Last Vitals:  Vitals:   08/23/23 1138 08/23/23 1143  BP: 106/60 114/84  Resp: 14   Temp:    SpO2: 98% 98%    Last Pain:  Vitals:   08/23/23 1040  TempSrc: Temporal  PainSc: 0-No pain                 Abriella Filkins C Taivon Haroon

## 2023-08-23 NOTE — Transfer of Care (Signed)
Immediate Anesthesia Transfer of Care Note  Patient: Leah Pearson  Procedure(s) Performed: COLONOSCOPY WITH PROPOFOL  Patient Location: PACU  Anesthesia Type: General  Level of Consciousness: awake, alert  and patient cooperative  Airway and Oxygen Therapy: Patient Spontanous Breathing and Patient connected to supplemental oxygen  Post-op Assessment: Post-op Vital signs reviewed, Patient's Cardiovascular Status Stable, Respiratory Function Stable, Patent Airway and No signs of Nausea or vomiting  Post-op Vital Signs: Reviewed and stable  Complications: No notable events documented.

## 2023-08-23 NOTE — H&P (Signed)
Leah Minium, MD [facility] [address], [address] [place], [place] [address] Phone:[phone] Fax : [phone]  Primary Care Physician:  Sherlene Shams, MD Primary Gastroenterologist:  Dr. Servando Snare  Pre-Procedure History & Physical: HPI:  Leah Pearson is a 57 y.o. female is here for an colonoscopy.   Past Medical History:  Diagnosis Date   Allergy    Arthritis    GERD (gastroesophageal reflux disease)    with esophagitis   History of tethered spinal cord    Hx: UTI (urinary tract infection)    Inflammatory polyps    located in gall bladder   Migraines    Spina bifida [facility])     Past Surgical History:  Procedure Laterality Date   ABDOMINAL HYSTERECTOMY  2011    CERVIX REMOVAL  2013   COLONOSCOPY WITH PROPOFOL N/A 07/15/2018   Procedure: COLONOSCOPY WITH PROPOFOL;  Surgeon: Leah Minium, MD;  Location: [facility] ENDOSCOPY;  Service: Endoscopy;  Laterality: N/A;   GREAT TOE ARTHRODESIS, JONES PROCEDURE  1979   grise/green  1972   LAPAROSCOPY  2011   with hysterectomy   lowered arches  1975   SPINAL FUSION  1974   spinal fusion/eggshell/moll rods/herrington  1984   TOTAL ANKLE REPLACEMENT Left 2015    Prior to Admission medications   Medication Sig Start Date End Date Taking? Authorizing Provider  Ca & Phos-Vit D-Mag (CALCIUM) [phone] TABS Take by mouth daily.   Yes [provider]  cholecalciferol (VITAMIN D) 1000 units tablet Take 1,000 Units by mouth daily.   Yes [provider]  COLLAGEN PO Take 3 capsules by mouth daily.   Yes [provider]  senna (SENOKOT) 8.6 MG tablet Take 2 tablets by mouth daily.    [provider]    Allergies as of 07/24/2023 - Review Complete 07/19/2023  Allergen Reaction Noted   Ferra-caps [iron] Rash 10/15/2012    Family History  Problem Relation Age of Onset   Diabetes Mother    Heart disease Mother        valvular dz no replacement   Irritable bowel syndrome Mother    Gallbladder disease  Mother    Bladder Cancer Father 33   Heart disease Father    Pancreatic cancer Father 88   Colon polyps Father    Stroke Maternal Grandmother    Heart attack Maternal Grandfather    Stomach cancer Paternal Grandmother 68   Breast cancer Paternal Grandmother 46   Colon cancer Neg Hx    Esophageal cancer Neg Hx    Rectal cancer Neg Hx     Social History   Socioeconomic History   Marital status: Married    Spouse name: Not on file   Number of children: 0   Years of education: Not on file   Highest education level: Bachelor's degree (e.g., BA, AB, BS)  Occupational History   Occupation: Artist  Tobacco Use   Smoking status: Never   Smokeless tobacco: Never  Substance and Sexual Activity   Alcohol use: Yes    Comment: occasionally   Drug use: No   Sexual activity: Not on file  Other Topics Concern   Not on file  Social History Narrative   Not on file   Social Determinants of Health   Financial Resource Strain: Low Risk  (07/19/2023)   Overall Financial Resource Strain (CARDIA)    Difficulty of Paying Living Expenses: Not hard at all  Food Insecurity: No Food Insecurity (07/19/2023)   Hunger Vital Sign  Worried About Programme researcher, broadcasting/film/video in the Last Year: Never true    Ran Out of Food in the Last Year: Never true  Transportation Needs: No Transportation Needs (07/19/2023)   PRAPARE - Administrator, Civil Service (Medical): No    Lack of Transportation (Non-Medical): No  Physical Activity: Insufficiently Active (07/19/2023)   Exercise Vital Sign    Days of Exercise per Week: 3 days    Minutes of Exercise per Session: 30 min  Stress: No Stress Concern Present (07/19/2023)   [employer] of Occupational Health - Occupational Stress Questionnaire    Feeling of Stress : Not at all  Social Connections: Moderately Integrated (07/19/2023)   Social Connection and Isolation Panel [NHANES]    Frequency of Communication with Friends and Family: More  than three times a week    Frequency of Social Gatherings with Friends and Family: Once a week    Attends Religious Services: More than 4 times per year    Active Member of [employer] or Organizations: No    Attends Engineer, structural: Not on file    Marital Status: Married  Catering manager Violence: Not on file    Review of Systems: See HPI, otherwise negative ROS  Physical Exam: BP 125/85   Temp 98.6 F (37 C) (Temporal)   Resp 15   Ht 5' (1.524 m)   Wt 65.2 kg   SpO2 98%   BMI 28.06 kg/m  General:   Alert,  pleasant and cooperative in NAD Head:  Normocephalic and atraumatic. Neck:  Supple; no masses or thyromegaly. Lungs:  Clear throughout to auscultation.    Heart:  Regular rate and rhythm. Abdomen:  Soft, nontender and nondistended. Normal bowel sounds, without guarding, and without rebound.   Neurologic:  Alert and  oriented x4;  grossly normal neurologically.  Impression/Plan: Leah Pearson is here for an colonoscopy to be performed for a history of adenomatous polyps on 2019   Risks, benefits, limitations, and alternatives regarding  colonoscopy have been reviewed with the patient.  Questions have been answered.  All parties agreeable.   Leah Minium, MD  08/23/2023, 11:12 AM

## 2023-08-23 NOTE — Op Note (Signed)
[facility] Gastroenterology Patient Name: Leah Pearson Procedure Date: 08/23/2023 11:13 AM MRN: [id number] Account #: [id number] Date of Birth: 05/25/1966 Admit Type: Outpatient Age: 57 Room: [facility] OR ROOM 01 Gender: Female Note Status: Finalized Instrument Name: [id number] Procedure:             Colonoscopy Indications:           High risk colon cancer surveillance: Personal history                         of colonic polyps Providers:             Midge Minium MD, MD Referring MD:          Midge Minium MD, MD (Referring MD), Duncan Dull, MD                         (Referring MD) Medicines:             Propofol per Anesthesia Complications:         No immediate complications. Procedure:             Pre-Anesthesia Assessment:                        - Prior to the procedure, a History and Physical was                         performed, and patient medications and allergies were                         reviewed. The patient's tolerance of previous                         anesthesia was also reviewed. The risks and benefits                         of the procedure and the sedation options and risks                         were discussed with the patient. All questions were                         answered, and informed consent was obtained. Prior                         Anticoagulants: The patient has taken no anticoagulant                         or antiplatelet agents. ASA Grade Assessment: II - A                         patient with mild systemic disease. After reviewing                         the risks and benefits, the patient was deemed in                         satisfactory condition to undergo the procedure.  After obtaining informed consent, the colonoscope was                         passed under direct vision. Throughout the procedure,                         the patient's blood pressure, pulse, and oxygen                          saturations were monitored continuously. The                         Colonoscope was introduced through the anus and                         advanced to the the cecum, identified by appendiceal                         orifice and ileocecal valve. The colonoscopy was                         performed without difficulty. The patient tolerated                         the procedure well. The quality of the bowel                         preparation was excellent. Findings:      The perianal and digital rectal examinations were normal.      Non-bleeding internal hemorrhoids were found during retroflexion. The       hemorrhoids were Grade I (internal hemorrhoids that do not prolapse). Impression:            - Non-bleeding internal hemorrhoids.                        - No specimens collected. Recommendation:        - Discharge patient to home.                        - Resume previous diet.                        - Continue present medications.                        - Repeat colonoscopy in 7 years for surveillance. Procedure Code(s):     --- Professional ---                        [phone], Colonoscopy, flexible; diagnostic, including                         collection of specimen(s) by brushing or washing, when                         performed (separate procedure) Diagnosis Code(s):     --- Professional ---                        Z86.010, Personal history of colonic polyps  CPT copyright 2022 American Medical Association. All rights reserved. The codes documented in this report are preliminary and upon coder review may  be revised to meet current compliance requirements. Midge Minium MD, MD 08/23/2023 11:32:52 AM This report has been signed electronically. Number of Addenda: 0 Note Initiated On: 08/23/2023 11:13 AM Scope Withdrawal Time: 0 hours 7 minutes 5 seconds  Total Procedure Duration: 0 hours 11 minutes 58 seconds  Estimated Blood Loss:  Estimated blood loss: none.      [facility]

## 2023-08-24 ENCOUNTER — Encounter: Payer: Self-pay | Admitting: Gastroenterology

## 2023-08-30 ENCOUNTER — Encounter: Payer: Self-pay | Admitting: Licensed Clinical Social Worker

## 2023-08-30 ENCOUNTER — Ambulatory Visit: Payer: Self-pay | Admitting: Licensed Clinical Social Worker

## 2023-08-30 ENCOUNTER — Telehealth: Payer: Self-pay | Admitting: Licensed Clinical Social Worker

## 2023-08-30 DIAGNOSIS — Z1379 Encounter for other screening for genetic and chromosomal anomalies: Secondary | ICD-10-CM

## 2023-08-30 NOTE — Progress Notes (Unsigned)
HPI:   Ms. Barmann was previously seen in the [facility] due to a family history of cancer and concerns regarding a hereditary predisposition to cancer. Please refer to our prior cancer genetics clinic note for more information regarding our discussion, assessment and recommendations, at the time. Ms. Paslay recent genetic test results were disclosed to her, as were recommendations warranted by these results. These results and recommendations are discussed in more detail below.  CANCER HISTORY:  Oncology History   No history exists.    FAMILY HISTORY:  We obtained a detailed, 4-generation family history.  Significant diagnoses are listed below: Family History  Problem Relation Age of Onset   Diabetes Mother    Heart disease Mother        valvular dz no replacement   Irritable bowel syndrome Mother    Gallbladder disease Mother    Bladder Cancer Father 81   Heart disease Father    Pancreatic cancer Father 63   Colon polyps Father    Stroke Maternal Grandmother    Heart attack Maternal Grandfather    Stomach cancer Paternal Grandmother 52   Breast cancer Paternal Grandmother 20   Colon cancer Neg Hx    Esophageal cancer Neg Hx    Rectal cancer Neg Hx     Ms. Poth has 1 full sister, 37, 1 paternal half sister, 85, 1 paternal half brother, 38, no cancers.   Ms. Fogal mother is living at 48. A maternal cousin had lung cancer at 39 and is living at 53. No other known cancers on this side of the family.   Ms. Thunberg father had bladder cancer at 3, pancreatic cancer at 1 and passed from metastatic disease at 57. Patient notes he was exposed to [employer]. Paternal grandmother had stomach and breast cancer at 58 and passed at 56.   Ms. Hanlan is unaware of previous family history of genetic testing for hereditary cancer risks. There is no reported Ashkenazi Jewish ancestry. There is no known consanguinity.     GENETIC TEST RESULTS:  The [facility]  CancerNext-Expanded+RNA Panel found no pathogenic mutations.   The CancerNext-Expanded gene panel offered by [facility] and includes sequencing, rearrangement, and RNA analysis for the following 76 genes: AIP, ALK, APC, ATM, AXIN2, BAP1, BARD1, BMPR1A, BRCA1, BRCA2, BRIP1, CDC73, CDH1, CDK4, CDKN1B, CDKN2A, CEBPA, CHEK2, CTNNA1, DDX41, DICER1, ETV6, FH, FLCN, GATA2, LZTR1, MAX, MBD4, MEN1, MET, MLH1, MSH2, MSH3, MSH6, MUTYH, NF1, NF2, NTHL1, PALB2, PHOX2B, PMS2, POT1, PRKAR1A, PTCH1, PTEN, RAD51C, RAD51D, RB1, RET, RUNX1, SDHA, SDHAF2, SDHB, SDHC, SDHD, SMAD4, SMARCA4, SMARCB1, SMARCE1, STK11, SUFU, TMEM127, TP53, TSC1, TSC2, VHL, and WT1 (sequencing and deletion/duplication); EGFR, HOXB13, KIT, MITF, PDGFRA, POLD1, and POLE (sequencing only); EPCAM and GREM1 (deletion/duplication only).   The test report has been scanned into EPIC and is located under the Molecular Pathology section of the Results Review tab.  A portion of the result report is included below for reference. Genetic testing reported out on 08/29/2023.       Even though a pathogenic variant was not identified, possible explanations for the cancer in the family may include: There may be no hereditary risk for cancer in the family. The cancers in Ms. Brys and/or her family may be sporadic/familial or due to other genetic and environmental factors. There may be a gene mutation in one of these genes that current testing methods cannot detect but that chance is small. There could be another gene that has not yet been discovered,  or that we have not yet tested, that is responsible for the cancer diagnoses in the family.  It is also possible there is a hereditary cause for the cancer in the family that Ms. Blossom did not inherit. Therefore, it is important to remain in touch with cancer genetics in the future so that we can continue to offer Ms. Heidtke the most up to date genetic testing.   ADDITIONAL GENETIC TESTING:  We discussed with  Ms. Shambach that her genetic testing was fairly extensive.  If there are additional relevant genes identified to increase cancer risk that can be analyzed in the future, we would be happy to discuss and coordinate this testing at that time.    CANCER SCREENING RECOMMENDATIONS:  Ms. Goubeaux test result is considered negative (normal).  This means that we have not identified a hereditary cause for her family history of cancer at this time.   An individual's cancer risk and medical management are not determined by genetic test results alone. Overall cancer risk assessment incorporates additional factors, including personal medical history, family history, and any available genetic information that may result in a personalized plan for cancer prevention and surveillance. Therefore, it is recommended she continue to follow the cancer management and screening guidelines provided by her  primary healthcare provider.  Based on the reported personal and family history, specific cancer screenings for Ms. Kirkland Hun and her family include:  Breast Cancer Screening:  The Tyrer-Cuzick model is one of multiple prediction models developed to estimate an individual's lifetime risk of developing breast cancer. The Tyrer-Cuzick model is endorsed by the [employer] (NCCN). This model includes many risk factors such as family history, endogenous estrogen exposure, and benign breast disease. The calculation is highly-dependent on the accuracy of clinical data provided by the patient and can change over time. The Tyrer-Cuzick model may be repeated to reflect new information in her personal or family history in the future.    Ms. Stefanko'sTyrer-Cuzick risk score is 7.7%.  She is encouraged to continue to be mindful of her family history and be diligent with general population breast screening, including annual mammograms beginning 10 years prior to the youngest diagnosis in her family or by age 60.   She is encouraged to contact us regarding any changes to her personal or family history, as her recommendations for screening would be altered significantly if her lifetime risk is determined to be greater than 20% based on updated information.       RECOMMENDATIONS FOR FAMILY MEMBERS:   Individuals in this family might be at some increased risk of developing cancer, over the general population risk, due to the family history of cancer.  Individuals in the family should notify their providers of the family history of cancer. We recommend women in this family have a yearly mammogram beginning at age 59, or 12 years younger than the earliest onset of cancer, an annual clinical breast exam, and perform monthly breast self-exams.  Family members should have colonoscopies by at age 30, or earlier, as recommended by their providers. Other members of the family may still carry a pathogenic variant in one of these genes that Ms. Durben did not inherit. Based on the family history, we recommend her paternal relatives, including her siblings, have genetic counseling and testing. Ms. Andrea will let us know if we can be of any assistance in coordinating genetic counseling and/or testing for this family member.     FOLLOW-UP:  Lastly, we discussed with  Ms. Reigle that cancer genetics is a rapidly advancing field and it is possible that new genetic tests will be appropriate for her and/or her family members in the future. We encouraged her to remain in contact with cancer genetics on an annual basis so we can update her personal and family histories and let her know of advances in cancer genetics that may benefit this family.   Our contact number was provided. Ms. Channing questions were answered to her satisfaction, and she knows she is welcome to call us at anytime with additional questions or concerns.    Lacy Duverney, MS, [facility] Genetic Counselor [email or web address] Phone: [phone]

## 2023-08-30 NOTE — Telephone Encounter (Signed)
I contacted Ms. Robbie via MyChart message to discuss her genetic testing results. No pathogenic variants were identified in the 71 genes analyzed. Detailed clinic note to follow.   The test report has been scanned into EPIC and is located under the Molecular Pathology section of the Results Review tab.  A portion of the result report is included below for reference.      Lacy Duverney, MS, [facility] Genetic Counselor [email or web address] Phone: [phone]

## 2024-07-15 ENCOUNTER — Encounter: Payer: Self-pay | Admitting: Internal Medicine

## 2024-07-21 ENCOUNTER — Ambulatory Visit (INDEPENDENT_AMBULATORY_CARE_PROVIDER_SITE_OTHER): Admitting: Internal Medicine

## 2024-07-21 ENCOUNTER — Encounter: Payer: Self-pay | Admitting: Internal Medicine

## 2024-07-21 VITALS — BP 112/78 | HR 85 | Ht 60.0 in | Wt 146.0 lb

## 2024-07-21 DIAGNOSIS — Z1231 Encounter for screening mammogram for malignant neoplasm of breast: Secondary | ICD-10-CM

## 2024-07-21 DIAGNOSIS — Q059 Spina bifida, unspecified: Secondary | ICD-10-CM

## 2024-07-21 DIAGNOSIS — Z23 Encounter for immunization: Secondary | ICD-10-CM | POA: Diagnosis not present

## 2024-07-21 DIAGNOSIS — E663 Overweight: Secondary | ICD-10-CM | POA: Diagnosis not present

## 2024-07-21 DIAGNOSIS — Z79890 Hormone replacement therapy (postmenopausal): Secondary | ICD-10-CM

## 2024-07-21 DIAGNOSIS — Z0000 Encounter for general adult medical examination without abnormal findings: Secondary | ICD-10-CM

## 2024-07-21 DIAGNOSIS — N951 Menopausal and female climacteric states: Secondary | ICD-10-CM

## 2024-07-21 DIAGNOSIS — N952 Postmenopausal atrophic vaginitis: Secondary | ICD-10-CM

## 2024-07-21 DIAGNOSIS — Z136 Encounter for screening for cardiovascular disorders: Secondary | ICD-10-CM | POA: Diagnosis not present

## 2024-07-21 MED ORDER — PROGESTERONE MICRONIZED 100 MG PO CAPS: 100.0000 mg | ORAL_CAPSULE | Freq: Every day | ORAL | 1 refills | Status: AC

## 2024-07-21 MED ORDER — ESTRADIOL 0.025 MG/24HR TD PTTW: 1.0000 | MEDICATED_PATCH | TRANSDERMAL | 12 refills | Status: AC

## 2024-07-21 NOTE — Patient Instructions (Addendum)
 YOUR MAMMOGRAM has been ordered , PLEASE CALL AND GET THIS SCHEDULED! [facility] - call [phone]  Veronda does  the scheduling for [facility] as well      I have ordered the coronary calcium  CT for you

## 2024-07-21 NOTE — Assessment & Plan Note (Signed)
 Previously managed by Paris Harvest MD with Estrogen, progesterone ( for well being) and testosterone , which I have counselled her to stop

## 2024-07-21 NOTE — Progress Notes (Unsigned)
 Patient ID: Leah Pearson, female    DOB: 12/05/1965  Age: 58 y.o. MRN: [id number]  The patient is here for annual preventive examination and management of other chronic and acute problems.   The risk factors are reflected in the social history.   The roster of all physicians providing medical care to patient - is listed in the Snapshot section of the chart.   Activities of daily living:  The patient is 100% independent in all ADLs: dressing, toileting, feeding as well as independent mobility   Home safety : The patient has smoke detectors in the home. They wear seatbelts.  There are no unsecured firearms at home. There is no violence in the home.    There is no risks for hepatitis, STDs or HIV. There is no   history of blood transfusion. They have no travel history to infectious disease endemic areas of the world.   The patient has seen their dentist in the last six month. They have seen their eye doctor in the last year. The patinet  denies slight hearing difficulty with regard to whispered voices and some television programs.  They have deferred audiologic testing in the last year.  They do not  have excessive sun exposure. Discussed the need for sun protection: hats, long sleeves and use of sunscreen if there is significant sun exposure.    Diet: the importance of a healthy diet is discussed. They do have a healthy diet.   The benefits of regular aerobic exercise were discussed. The patient  exercises  5 days per week  for  60 minutes.    Depression screen: there are no signs or vegative symptoms of depression- irritability, change in appetite, anhedonia, sadness/tearfullness.   The following portions of the patient's history were reviewed and updated as appropriate: allergies, current medications, past family history, past medical history,  past surgical history, past social history  and problem list.   Visual acuity was not assessed per patient preference since the patient has regular  follow up with an  ophthalmologist. Hearing and body mass index were assessed and reviewed.    During the course of the visit the patient was educated and counseled about appropriate screening and preventive services including : fall prevention , diabetes screening, nutrition counseling, colorectal cancer screening, and recommended immunizations.    Chief Complaint:  HRT therapy currently managed with Paris Harvest MD with Progesterone 200 mg at bedtime ,  topical testosterone 2%  20 mg/mL  , Estradiol 0.0375 mg transdermal , along with vaginal estrogen  and DHEA 5 mg daily.  She has not seen any improvement in libido but would like to continue both forms of estrogen and progesterone   Review of Symptoms  Patient denies headache, fevers, malaise, unintentional weight loss, skin rash, eye pain, sinus congestion and sinus pain, sore throat, dysphagia,  hemoptysis , cough, dyspnea, wheezing, chest pain, palpitations, orthopnea, edema, abdominal pain, nausea, melena, diarrhea, constipation, flank pain, dysuria, hematuria, urinary  Frequency, nocturia, numbness, tingling, seizures,  Focal weakness, Loss of consciousness,  Tremor, insomnia, depression, anxiety, and suicidal ideation.    Physical Exam:  BP 112/78   Pulse 85   Ht 5' (1.524 m)   Wt 146 lb (66.2 kg)   SpO2 98%   BMI 28.51 kg/m    Physical Exam Vitals reviewed.  Constitutional:      General: She is not in acute distress.    Appearance: Normal appearance. She is well-developed and normal weight. She is not ill-appearing, toxic-appearing  or diaphoretic.  HENT:     Head: Normocephalic.     Right Ear: Tympanic membrane, ear canal and external ear normal. There is no impacted cerumen.     Left Ear: Tympanic membrane, ear canal and external ear normal. There is no impacted cerumen.     Nose: Nose normal.     Mouth/Throat:     Mouth: Mucous membranes are moist.     Pharynx: Oropharynx is clear.  Eyes:     General: No scleral icterus.        Right eye: No discharge.        Left eye: No discharge.     Conjunctiva/sclera: Conjunctivae normal.     Pupils: Pupils are equal, round, and reactive to light.  Neck:     Thyroid : No thyromegaly.     Vascular: No carotid bruit or JVD.  Cardiovascular:     Rate and Rhythm: Normal rate and regular rhythm.     Heart sounds: Normal heart sounds.  Pulmonary:     Effort: Pulmonary effort is normal. No respiratory distress.     Breath sounds: Normal breath sounds.  Chest:  Breasts:    Breasts are symmetrical.     Right: Normal. No swelling, inverted nipple, mass, nipple discharge, skin change or tenderness.     Left: Normal. No swelling, inverted nipple, mass, nipple discharge, skin change or tenderness.  Abdominal:     General: Bowel sounds are normal.     Palpations: Abdomen is soft. There is no mass.     Tenderness: There is no abdominal tenderness. There is no guarding or rebound.  Musculoskeletal:        General: Normal range of motion.     Cervical back: Normal range of motion and neck supple.  Lymphadenopathy:     Cervical: No cervical adenopathy.     Upper Body:     Right upper body: No supraclavicular, axillary or pectoral adenopathy.     Left upper body: No supraclavicular, axillary or pectoral adenopathy.  Skin:    General: Skin is warm and dry.  Neurological:     General: No focal deficit present.     Mental Status: She is alert and oriented to person, place, and time. Mental status is at baseline.  Psychiatric:        Mood and Affect: Mood normal.        Behavior: Behavior normal.        Thought Content: Thought content normal.        Judgment: Judgment normal.     Assessment and Plan: Encounter for screening mammogram for malignant neoplasm of breast -     3D Screening Mammogram, Left and Right; Future  Overweight -     Comprehensive metabolic panel with GFR -     CBC with Differential/Platelet -     LDL cholesterol, direct -     Hemoglobin A1c -      Lipid panel -     TSH  Need for hepatitis B booster vaccination -     Hepatitis B surface antibody,quantitative  Encounter for screening for coronary artery disease -     CT CARDIAC SCORING (SELF PAY ONLY); Future  Visit for preventive health examination Assessment & Plan: age appropriate education and counseling updated, referrals for preventative services and immunizations addressed, dietary and smoking counseling addressed, most recent labs reviewed.  I have personally reviewed and have noted:   1) the patient's medical and social history 2) The pt's use  of alcohol, tobacco, and illicit drugs 3) The patient's current medications and supplements 4) Functional ability including ADL's, fall risk, home safety risk, hearing and visual impairment 5) Diet and physical activities 6) Evidence for depression or mood disorder 7) The patient's height, weight, and BMI have been recorded in the chart 8) CORONARY CALCIUM CT SCAN has been requested by patient and ordered .     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  She was  doing well since her last  ankle surgery which required osteotomy and non weight bearing status, but has developed anterior pain due to bone spurring and is scheduled to see her orthopedist  in a few weeks    Menopause syndrome Assessment & Plan: I have agreed to manage with estrogen and  progesterone,supplementation,  but not with testosterone.    Other orders -     Estradiol; Place 1 patch onto the skin 2 (two) times a week.  Dispense: 8 patch; Refill: 12 -     Progesterone; Take 1 capsule (100 mg total) by mouth daily.  Dispense: 90 capsule; Refill: 1    No follow-ups on file.  Verneita LITTIE Kettering, MD

## 2024-07-21 NOTE — Assessment & Plan Note (Signed)
 age appropriate education and counseling updated, referrals for preventative services and immunizations addressed, dietary and smoking counseling addressed, most recent labs reviewed.  I have personally reviewed and have noted:   1) the patient's medical and social history 2) The pt's use of alcohol, tobacco, and illicit drugs 3) The patient's current medications and supplements 4) Functional ability including ADL's, fall risk, home safety risk, hearing and visual impairment 5) Diet and physical activities 6) Evidence for depression or mood disorder 7) The patient's height, weight, and BMI have been recorded in the chart 8) CORONARY CALCIUM CT SCAN has been requested by patient and ordered .     I have made referrals, and provided counseling and education based on review of the above

## 2024-07-22 DIAGNOSIS — N951 Menopausal and female climacteric states: Secondary | ICD-10-CM | POA: Insufficient documentation

## 2024-07-22 LAB — COMPREHENSIVE METABOLIC PANEL WITH GFR
ALT: 10 IU/L (ref 0–32)
AST: 17 IU/L (ref 0–40)
Albumin: 4.2 g/dL (ref 3.8–4.9)
Alkaline Phosphatase: 53 IU/L (ref 49–135)
BUN/Creatinine Ratio: 13 (ref 9–23)
BUN: 10 mg/dL (ref 6–24)
Bilirubin Total: 0.4 mg/dL (ref 0.0–1.2)
CO2: 22 mmol/L (ref 20–29)
Calcium: 9.6 mg/dL (ref 8.7–10.2)
Chloride: 104 mmol/L (ref 96–106)
Creatinine, Ser: 0.77 mg/dL (ref 0.57–1.00)
Globulin, Total: 2.8 g/dL (ref 1.5–4.5)
Glucose: 88 mg/dL (ref 70–99)
Potassium: 4.5 mmol/L (ref 3.5–5.2)
Sodium: 140 mmol/L (ref 134–144)
Total Protein: 7 g/dL (ref 6.0–8.5)
eGFR: 90 mL/min/1.73 (ref >59)

## 2024-07-22 LAB — LIPID PANEL
Chol/HDL Ratio: 2.6 ratio (ref 0.0–4.4)
Cholesterol, Total: 226 mg/dL — ABNORMAL HIGH (ref 100–199)
HDL: 86 mg/dL (ref >39)
LDL Chol Calc (NIH): 129 mg/dL — ABNORMAL HIGH (ref 0–99)
Triglycerides: 66 mg/dL (ref 0–149)
VLDL Cholesterol Cal: 11 mg/dL (ref 5–40)

## 2024-07-22 LAB — HEMOGLOBIN A1C
Est. average glucose Bld gHb Est-mCnc: 105 mg/dL
Hgb A1c MFr Bld: 5.3 % (ref 4.8–5.6)

## 2024-07-22 LAB — CBC WITH DIFFERENTIAL/PLATELET
Basophils Absolute: 0.1 x10E3/uL (ref 0.0–0.2)
Basos: 1 %
EOS (ABSOLUTE): 0 x10E3/uL (ref 0.0–0.4)
Eos: 1 %
Hematocrit: 39.7 % (ref 34.0–46.6)
Hemoglobin: 13.2 g/dL (ref 11.1–15.9)
Immature Grans (Abs): 0 x10E3/uL (ref 0.0–0.1)
Immature Granulocytes: 0 %
Lymphocytes Absolute: 2.5 x10E3/uL (ref 0.7–3.1)
Lymphs: 38 %
MCH: 30.7 pg (ref 26.6–33.0)
MCHC: 33.2 g/dL (ref 31.5–35.7)
MCV: 92 fL (ref 79–97)
Monocytes Absolute: 0.5 x10E3/uL (ref 0.1–0.9)
Monocytes: 7 %
Neutrophils Absolute: 3.5 x10E3/uL (ref 1.4–7.0)
Neutrophils: 53 %
Platelets: 256 x10E3/uL (ref 150–450)
RBC: 4.3 x10E6/uL (ref 3.77–5.28)
RDW: 12.5 % (ref 11.7–15.4)
WBC: 6.6 x10E3/uL (ref 3.4–10.8)

## 2024-07-22 LAB — HEPATITIS B SURFACE ANTIBODY, QUANTITATIVE: Hepatitis B Surf Ab Quant: 339 m[IU]/mL

## 2024-07-22 LAB — TSH: TSH: 2.52 u[IU]/mL (ref 0.450–4.500)

## 2024-07-22 LAB — LDL CHOLESTEROL, DIRECT: LDL Direct: 129 mg/dL — ABNORMAL HIGH (ref 0–99)

## 2024-07-22 NOTE — Assessment & Plan Note (Signed)
 I have agreed to assume management with topical estrogen

## 2024-07-22 NOTE — Assessment & Plan Note (Addendum)
 I have agreed to manage with estrogen and progesterone,supplementation,  but not with testosterone.

## 2024-07-22 NOTE — Assessment & Plan Note (Signed)
 S/p multiple prior orthopedic surgeries.  She was  doing well since her last  ankle surgery which required osteotomy and non weight bearing status, but has developed anterior pain due to bone spurring and is scheduled to see her orthopedist  in a few weeks

## 2024-07-24 ENCOUNTER — Ambulatory Visit: Payer: Self-pay | Admitting: Internal Medicine

## 2024-07-28 ENCOUNTER — Ambulatory Visit
Admission: RE | Admit: 2024-07-28 | Discharge: 2024-07-28 | Disposition: A | Discharge status: Home / Self Care | Payer: Self-pay | Source: Ambulatory Visit | Attending: Internal Medicine | Admitting: Internal Medicine

## 2024-07-28 DIAGNOSIS — Z136 Encounter for screening for cardiovascular disorders: Secondary | ICD-10-CM | POA: Insufficient documentation

## 2024-11-03 ENCOUNTER — Encounter

## 2024-11-13 ENCOUNTER — Ambulatory Visit: Admission: RE | Admit: 2024-11-13 | Source: Ambulatory Visit

## 2024-11-13 DIAGNOSIS — Z1231 Encounter for screening mammogram for malignant neoplasm of breast: Secondary | ICD-10-CM

## 2025-07-23 ENCOUNTER — Encounter: Admitting: Internal Medicine
